# Patient Record
Sex: Female | Born: 1970 | Race: White | Hispanic: Yes | Marital: Married | Smoking: Never smoker
Health system: Southern US, Community
[De-identification: ages and names within clinical notes are randomized; demographics above are authoritative.]

---

## 2000-05-01 ENCOUNTER — Ambulatory Visit (HOSPITAL_COMMUNITY): Admission: RE | Admit: 2000-05-01 | Discharge: 2000-05-01 | Payer: Self-pay | Admitting: *Deleted

## 2000-08-06 ENCOUNTER — Inpatient Hospital Stay (HOSPITAL_COMMUNITY): Admission: AD | Admit: 2000-08-06 | Discharge: 2000-08-08 | Payer: Self-pay | Admitting: *Deleted

## 2000-08-09 ENCOUNTER — Encounter: Admission: RE | Admit: 2000-08-09 | Discharge: 2000-08-13 | Payer: Self-pay | Admitting: *Deleted

## 2000-08-20 ENCOUNTER — Encounter: Admission: RE | Admit: 2000-08-20 | Discharge: 2000-09-19 | Payer: Self-pay | Admitting: *Deleted

## 2003-08-09 ENCOUNTER — Ambulatory Visit (HOSPITAL_COMMUNITY): Admission: RE | Admit: 2003-08-09 | Discharge: 2003-08-09 | Payer: Self-pay | Admitting: *Deleted

## 2003-11-09 ENCOUNTER — Inpatient Hospital Stay (HOSPITAL_COMMUNITY): Admission: AD | Admit: 2003-11-09 | Discharge: 2003-11-09 | Payer: Self-pay | Admitting: Obstetrics & Gynecology

## 2003-11-14 ENCOUNTER — Encounter: Admission: RE | Admit: 2003-11-14 | Discharge: 2003-11-14 | Payer: Self-pay | Admitting: *Deleted

## 2003-11-15 ENCOUNTER — Inpatient Hospital Stay (HOSPITAL_COMMUNITY): Admission: AD | Admit: 2003-11-15 | Discharge: 2003-11-17 | Payer: Self-pay | Admitting: *Deleted

## 2005-02-06 ENCOUNTER — Ambulatory Visit (HOSPITAL_COMMUNITY): Admission: RE | Admit: 2005-02-06 | Discharge: 2005-02-06 | Payer: Self-pay | Admitting: *Deleted

## 2005-06-29 ENCOUNTER — Ambulatory Visit: Payer: Self-pay | Admitting: Family Medicine

## 2005-06-29 ENCOUNTER — Inpatient Hospital Stay (HOSPITAL_COMMUNITY): Admission: AD | Admit: 2005-06-29 | Discharge: 2005-07-01 | Payer: Self-pay | Admitting: *Deleted

## 2005-07-16 ENCOUNTER — Inpatient Hospital Stay (HOSPITAL_COMMUNITY): Admission: AD | Admit: 2005-07-16 | Discharge: 2005-07-16 | Payer: Self-pay | Admitting: Obstetrics and Gynecology

## 2006-04-02 ENCOUNTER — Emergency Department (HOSPITAL_COMMUNITY): Admission: EM | Admit: 2006-04-02 | Discharge: 2006-04-02 | Payer: Self-pay | Admitting: Emergency Medicine

## 2013-08-10 ENCOUNTER — Other Ambulatory Visit (HOSPITAL_COMMUNITY): Payer: Self-pay | Admitting: Family

## 2013-08-10 DIAGNOSIS — Z1231 Encounter for screening mammogram for malignant neoplasm of breast: Secondary | ICD-10-CM

## 2013-08-22 ENCOUNTER — Ambulatory Visit (HOSPITAL_COMMUNITY)
Admission: RE | Admit: 2013-08-22 | Discharge: 2013-08-22 | Disposition: A | Discharge status: Home / Self Care | Payer: Self-pay | Source: Ambulatory Visit | Attending: Family | Admitting: Family

## 2013-08-22 DIAGNOSIS — Z1231 Encounter for screening mammogram for malignant neoplasm of breast: Secondary | ICD-10-CM

## 2014-08-07 ENCOUNTER — Other Ambulatory Visit (HOSPITAL_COMMUNITY): Payer: Self-pay | Admitting: Nurse Practitioner

## 2014-08-07 DIAGNOSIS — Z1231 Encounter for screening mammogram for malignant neoplasm of breast: Secondary | ICD-10-CM

## 2014-08-24 ENCOUNTER — Ambulatory Visit (HOSPITAL_COMMUNITY)
Admission: RE | Admit: 2014-08-24 | Discharge: 2014-08-24 | Disposition: A | Discharge status: Home / Self Care | Payer: Self-pay | Source: Ambulatory Visit | Attending: Nurse Practitioner | Admitting: Nurse Practitioner

## 2014-08-24 DIAGNOSIS — Z1231 Encounter for screening mammogram for malignant neoplasm of breast: Secondary | ICD-10-CM

## 2015-08-01 ENCOUNTER — Other Ambulatory Visit: Payer: Self-pay

## 2016-11-12 ENCOUNTER — Other Ambulatory Visit: Payer: Self-pay | Admitting: Obstetrics & Gynecology

## 2016-11-12 DIAGNOSIS — Z1231 Encounter for screening mammogram for malignant neoplasm of breast: Secondary | ICD-10-CM

## 2016-11-26 ENCOUNTER — Ambulatory Visit
Admission: RE | Admit: 2016-11-26 | Discharge: 2016-11-26 | Disposition: A | Discharge status: Home / Self Care | Payer: No Typology Code available for payment source | Source: Ambulatory Visit | Attending: Obstetrics & Gynecology | Admitting: Obstetrics & Gynecology

## 2016-11-26 DIAGNOSIS — Z1231 Encounter for screening mammogram for malignant neoplasm of breast: Secondary | ICD-10-CM

## 2016-11-27 ENCOUNTER — Other Ambulatory Visit: Payer: Self-pay | Admitting: Obstetrics & Gynecology

## 2016-11-27 DIAGNOSIS — R928 Other abnormal and inconclusive findings on diagnostic imaging of breast: Secondary | ICD-10-CM

## 2016-12-08 ENCOUNTER — Other Ambulatory Visit (HOSPITAL_COMMUNITY): Payer: Self-pay | Admitting: *Deleted

## 2016-12-08 DIAGNOSIS — R928 Other abnormal and inconclusive findings on diagnostic imaging of breast: Secondary | ICD-10-CM

## 2016-12-25 ENCOUNTER — Ambulatory Visit: Admission: RE | Admit: 2016-12-25 | Payer: No Typology Code available for payment source | Source: Ambulatory Visit

## 2016-12-25 ENCOUNTER — Ambulatory Visit
Admission: RE | Admit: 2016-12-25 | Discharge: 2016-12-25 | Disposition: A | Discharge status: Home / Self Care | Payer: No Typology Code available for payment source | Source: Ambulatory Visit | Attending: Obstetrics and Gynecology | Admitting: Obstetrics and Gynecology

## 2016-12-25 ENCOUNTER — Encounter (HOSPITAL_COMMUNITY): Payer: Self-pay

## 2016-12-25 ENCOUNTER — Ambulatory Visit (HOSPITAL_COMMUNITY)
Admission: RE | Admit: 2016-12-25 | Discharge: 2016-12-25 | Disposition: A | Discharge status: Home / Self Care | Payer: Self-pay | Source: Ambulatory Visit | Attending: Obstetrics and Gynecology | Admitting: Obstetrics and Gynecology

## 2016-12-25 VITALS — BP 140/80 | HR 56 | Temp 97.8°F | Ht 60.0 in | Wt 162.1 lb

## 2016-12-25 DIAGNOSIS — R928 Other abnormal and inconclusive findings on diagnostic imaging of breast: Secondary | ICD-10-CM

## 2016-12-25 DIAGNOSIS — Z1239 Encounter for other screening for malignant neoplasm of breast: Secondary | ICD-10-CM

## 2016-12-25 NOTE — Progress Notes (Signed)
[facility] due to recommending additional imaging of the right breast. Screening mammogram completed 11/26/2016.  Pap Smear: Pap smear not completed today. [facility] and normal per patient. Per patient has no history of an abnormal Pap smear. No Pap smear results are in EPIC.  Physical exam: Breasts Breasts symmetrical. No skin abnormalities bilateral breasts. No nipple retraction bilateral breasts. No nipple discharge bilateral breasts. No lymphadenopathy. No lumps palpated bilateral breasts. No complaints of pain or tenderness on exam. [facility] a right breast diagnostic mammogram and breast ultrasound per recommendation. Appointment scheduled for Thursday, December 25, 2016 at 1250.        Pelvic/Bimanual No Pap smear completed today since last Pap smear was in 2016 per patient. Pap smear not indicated per BCCCP guidelines.   Smoking History: Patient has never smoked.  Patient Navigation: Patient education provided. Access to services provided [facility]. Spanish interpreter provided.  Used Spanish interpreter [employer]anca Lindner from [employer].

## 2016-12-25 NOTE — Patient Instructions (Addendum)
Explained breast self awareness with Deanna Anderson. Patient did not need a Pap smear today due to last Pap smear was in 2016 per patient. Let her know BCCCP will cover Pap smears every 3 years unless has a history of abnormal Pap smears. [facility] for a right breast diagnostic mammogram and breast ultrasound per recommendation. Appointment scheduled for Thursday, December 25, 2016 at 1250. Deanna Prosther Anderson verbalized understanding.  Brannock, Kathaleen Maserhristine Poll, RN 2:32 PM

## 2017-02-06 NOTE — Addendum Note (Signed)
Encounter addended by: Priscille Heidelberg, RN on: 02/06/2017  1:46 PM<BR>    Actions taken: Charge Capture section accepted

## 2017-11-11 ENCOUNTER — Encounter (HOSPITAL_COMMUNITY): Payer: Self-pay

## 2017-11-17 ENCOUNTER — Other Ambulatory Visit: Payer: Self-pay | Admitting: Obstetrics & Gynecology

## 2017-11-17 DIAGNOSIS — Z1231 Encounter for screening mammogram for malignant neoplasm of breast: Secondary | ICD-10-CM

## 2017-12-04 ENCOUNTER — Other Ambulatory Visit: Payer: Self-pay | Admitting: Obstetrics and Gynecology

## 2017-12-04 DIAGNOSIS — Z1231 Encounter for screening mammogram for malignant neoplasm of breast: Secondary | ICD-10-CM

## 2018-01-05 ENCOUNTER — Ambulatory Visit
Admission: RE | Admit: 2018-01-05 | Discharge: 2018-01-05 | Disposition: A | Discharge status: Home / Self Care | Payer: Self-pay | Source: Ambulatory Visit | Attending: Obstetrics and Gynecology | Admitting: Obstetrics and Gynecology

## 2018-01-05 ENCOUNTER — Encounter (HOSPITAL_COMMUNITY): Payer: Self-pay

## 2018-01-05 ENCOUNTER — Ambulatory Visit (HOSPITAL_COMMUNITY)
Admission: RE | Admit: 2018-01-05 | Discharge: 2018-01-05 | Disposition: A | Discharge status: Home / Self Care | Payer: Self-pay | Source: Ambulatory Visit | Attending: Obstetrics and Gynecology | Admitting: Obstetrics and Gynecology

## 2018-01-05 VITALS — BP 124/70 | Ht 61.0 in

## 2018-01-05 DIAGNOSIS — Z1231 Encounter for screening mammogram for malignant neoplasm of breast: Secondary | ICD-10-CM

## 2018-01-05 DIAGNOSIS — Z1239 Encounter for other screening for malignant neoplasm of breast: Secondary | ICD-10-CM

## 2018-01-05 NOTE — Patient Instructions (Addendum)
Explained breast self awareness with Maceo ProEsther Martinez-Rivera. Patient did not need a Pap smear today due to last Pap smear was in June 2019 per patient. Let her know BCCCP will cover Pap smears every 3 years unless has a history of abnormal Pap smears. [facility] a [facility] mamm[facility]. Appointment scheduled for Tuesday, January 05, 2018 at 1240. Let patient know that the Breast Center will follow-up with her within the next couple of weeks with results of mammogram by letter or phone. Maceo Prosther Martinez-Rivera verbalized understanding.  Anicia Leuthold, Kathaleen Maserhristine Poll, RN 1:34 PM

## 2018-01-05 NOTE — Progress Notes (Signed)
No complaints today.   Pap Smear: Pap smear not completed today. [facility] and normal per patient. Per patient has no history of an abnormal Pap smear. No Pap smear results are in EPIC.  Physical exam: Breasts Breasts symmetrical. No skin abnormalities bilateral breasts. No nipple retraction bilateral breasts. No nipple discharge bilateral breasts. No lymphadenopathy. No lumps palpated bilateral breasts. No complaints of pain or tenderness on exam. [facility] a screening mammogram. Appointment scheduled for Tuesday, January 05, 2018 at 1240.        Pelvic/Bimanual No Pap smear completed today since last Pap smear was in June 2019 per patient. Pap smear not indicated per BCCCP guidelines.   Smoking History: Patient has never smoked.  Patient Navigation: Patient education provided. Access to services provided [facility]. Spanish interpreter provided.   Breast and Cervical Cancer Risk Assessment: Patient has no family history of breast cancer, known genetic mutations, or radiation treatment to the chest before age 45. Patient has no history of cervical dysplasia, immunocompromised, or DES exposure in-utero.  Risk Assessment    Risk Scores      01/05/2018   Last edited by: Priscille HeidelbergBrannock, Neymar Dowe P, RN   5-year risk: 0.9 %   Lifetime risk: 10.2 %          Used Spanish interpreter Nile RiggsMariel Gallego from [employer].

## 2018-01-06 ENCOUNTER — Encounter (HOSPITAL_COMMUNITY): Payer: Self-pay | Admitting: *Deleted

## 2019-05-04 ENCOUNTER — Other Ambulatory Visit (HOSPITAL_COMMUNITY): Payer: Self-pay | Admitting: *Deleted

## 2019-05-04 DIAGNOSIS — Z1231 Encounter for screening mammogram for malignant neoplasm of breast: Secondary | ICD-10-CM

## 2019-06-28 ENCOUNTER — Other Ambulatory Visit: Payer: Self-pay

## 2019-06-28 ENCOUNTER — Encounter (HOSPITAL_COMMUNITY): Payer: Self-pay

## 2019-06-28 ENCOUNTER — Ambulatory Visit
Admission: RE | Admit: 2019-06-28 | Discharge: 2019-06-28 | Disposition: A | Discharge status: Home / Self Care | Payer: No Typology Code available for payment source | Source: Ambulatory Visit | Attending: Obstetrics and Gynecology | Admitting: Obstetrics and Gynecology

## 2019-06-28 ENCOUNTER — Ambulatory Visit (HOSPITAL_COMMUNITY)
Admission: RE | Admit: 2019-06-28 | Discharge: 2019-06-28 | Disposition: A | Discharge status: Home / Self Care | Payer: No Typology Code available for payment source | Source: Ambulatory Visit | Attending: Obstetrics and Gynecology | Admitting: Obstetrics and Gynecology

## 2019-06-28 DIAGNOSIS — Z1239 Encounter for other screening for malignant neoplasm of breast: Secondary | ICD-10-CM | POA: Insufficient documentation

## 2019-06-28 DIAGNOSIS — Z1231 Encounter for screening mammogram for malignant neoplasm of breast: Secondary | ICD-10-CM

## 2019-06-28 NOTE — Patient Instructions (Signed)
Explained breast self awareness with [name]. Patient did not need a Pap smear today due to last Pap smear was 11/11/2017. Let her know BCCCP will cover Pap smears every 3 years unless has a history of abnormal Pap smears. Referred patient to the [facility] of [facility] for a screening mammogram. Appointment scheduled for Tuesday, June 28, 2019 at 1430. Patient aware of appointment and will be there. Let patient know the Breast Center will follow up with her within the next couple weeks with results of her mammogram by letter or phone. Deanna Anderson verbalized understanding.  Clora Ohmer, [name], RN 1:25 PM

## 2019-06-28 NOTE — Progress Notes (Signed)
No complaints today.   Pap Smear: Pap smear not completed today. Last Pap smear was 11/11/2017 at the [facility] and normal. Per patient has no history of an abnormal Pap smear. Last Pap smear result is in EPIC.  Physical exam: Breasts Breasts symmetrical. No skin abnormalities bilateral breasts. No nipple retraction bilateral breasts. No nipple discharge bilateral breasts. No lymphadenopathy. No lumps palpated bilateral breasts. No complaints of pain or tenderness on exam. Referred patient to the [facility] of [facility] for a screening mammogram. Appointment scheduled for Tuesday, June 28, 2019 at 1430.        Pelvic/Bimanual No Pap smear completed today since last Pap smear was 11/11/2017. Pap smear not indicated per BCCCP guidelines.   Smoking History: Patient has never smoked.  Patient Navigation: Patient education provided. Access to services provided for patient through [facility] program. Spanish interpreter provided.   Breast and Cervical Cancer Risk Assessment: Patient has no family history of breast cancer, known genetic mutations, or radiation treatment to the chest before age 58. Patient has no history of cervical dysplasia, immunocompromised, or DES exposure in-utero.  Risk Assessment    Risk Scores      06/28/2019 01/05/2018   Last edited by: Deanna Rutherford, LPN Deanna Anderson, Deanna Grippe, RN   5-year risk: 0.9 % 0.9 %   Lifetime risk: 10 % 10.2 %         Used Spanish interpreter Deanna Anderson from [employer].

## 2019-10-13 ENCOUNTER — Ambulatory Visit: Payer: No Typology Code available for payment source | Attending: Internal Medicine

## 2019-10-13 DIAGNOSIS — Z23 Encounter for immunization: Secondary | ICD-10-CM

## 2019-10-13 NOTE — Progress Notes (Signed)
   Covid-19 Vaccination Clinic  Name:  Deanna Anderson    MRN: [id number] DOB: 05/23/1970  10/13/2019  [name] was observed post Covid-19 immunization for 15 minutes without incident. She was provided with Vaccine Information Sheet and instruction to access the V-Safe system.   [name] was instructed to call 911 with any severe reactions post vaccine: Marland Kitchen Difficulty breathing  . Swelling of face and throat  . A fast heartbeat  . A bad rash all over body  . Dizziness and weakness   Immunizations Administered    Name Date Dose VIS Date Route   Pfizer COVID-19 Vaccine 10/13/2019  9:49 AM 0.3 mL 07/13/2018 Intramuscular   Manufacturer: [employer], [employer]   Lot: [id number]   NDC: [address]-4

## 2019-11-07 ENCOUNTER — Ambulatory Visit: Payer: No Typology Code available for payment source | Attending: Internal Medicine

## 2019-11-07 DIAGNOSIS — Z23 Encounter for immunization: Secondary | ICD-10-CM

## 2019-11-07 NOTE — Progress Notes (Signed)
   Covid-19 Vaccination Clinic  Name:  Deanna Anderson    MRN: [id number] DOB: Aug 14, 1970  11/07/2019  [name] was observed post Covid-19 immunization for 15 minutes without incident. She was provided with Vaccine Information Sheet and instruction to access the V-Safe system.   [name] was instructed to call 911 with any severe reactions post vaccine: Marland Kitchen Difficulty breathing  . Swelling of face and throat  . A fast heartbeat  . A bad rash all over body  . Dizziness and weakness   Immunizations Administered    Name Date Dose VIS Date Route   Pfizer COVID-19 Vaccine 11/07/2019  8:27 AM 0.3 mL 07/13/2018 Intramuscular   Manufacturer: [employer], [employer]   Lot: [id number]   NDC: [address]-0

## 2020-03-27 ENCOUNTER — Other Ambulatory Visit: Payer: Self-pay | Admitting: Obstetrics and Gynecology

## 2020-03-27 DIAGNOSIS — Z1231 Encounter for screening mammogram for malignant neoplasm of breast: Secondary | ICD-10-CM

## 2020-06-21 ENCOUNTER — Ambulatory Visit
Admission: RE | Admit: 2020-06-21 | Discharge: 2020-06-21 | Disposition: A | Discharge status: Home / Self Care | Payer: No Typology Code available for payment source | Source: Ambulatory Visit | Attending: Obstetrics and Gynecology | Admitting: Obstetrics and Gynecology

## 2020-06-21 DIAGNOSIS — Z1231 Encounter for screening mammogram for malignant neoplasm of breast: Secondary | ICD-10-CM

## 2021-05-01 IMAGING — MG MM DIGITAL SCREENING BILAT W/ TOMO AND CAD
6 of 10 series · 6 of 30 positions shown · non-contrast
Comparison: Previous exam(s).

CLINICAL DATA: Screening.

EXAM:
DIGITAL SCREENING BILATERAL MAMMOGRAM WITH TOMOSYNTHESIS AND CAD

[L CC synth-2D]
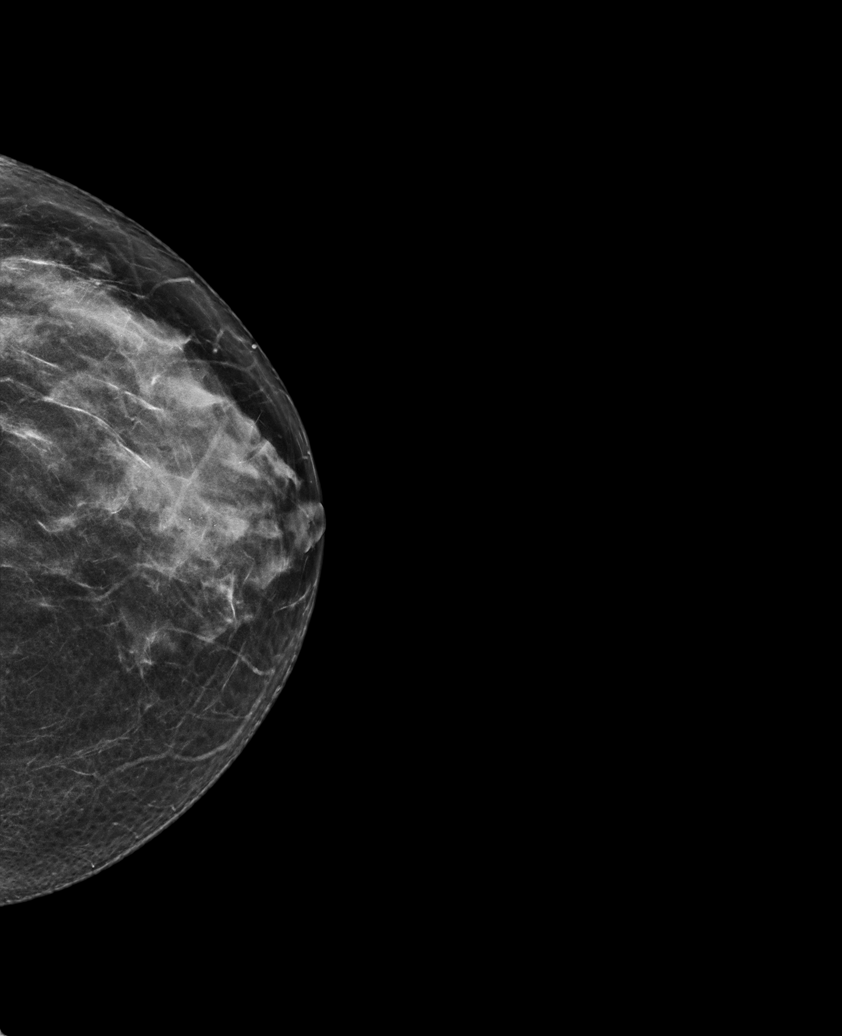

[R MLO synth-2D]
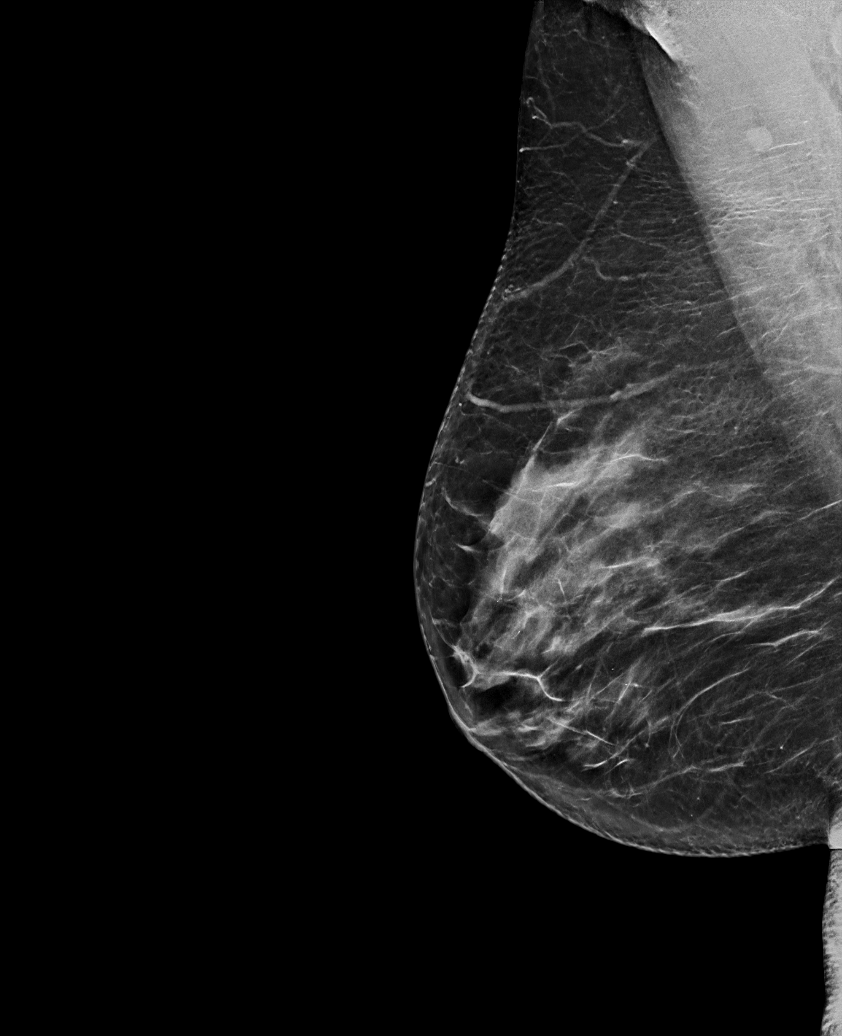

[L MLO synth-2D (1 of 2)]
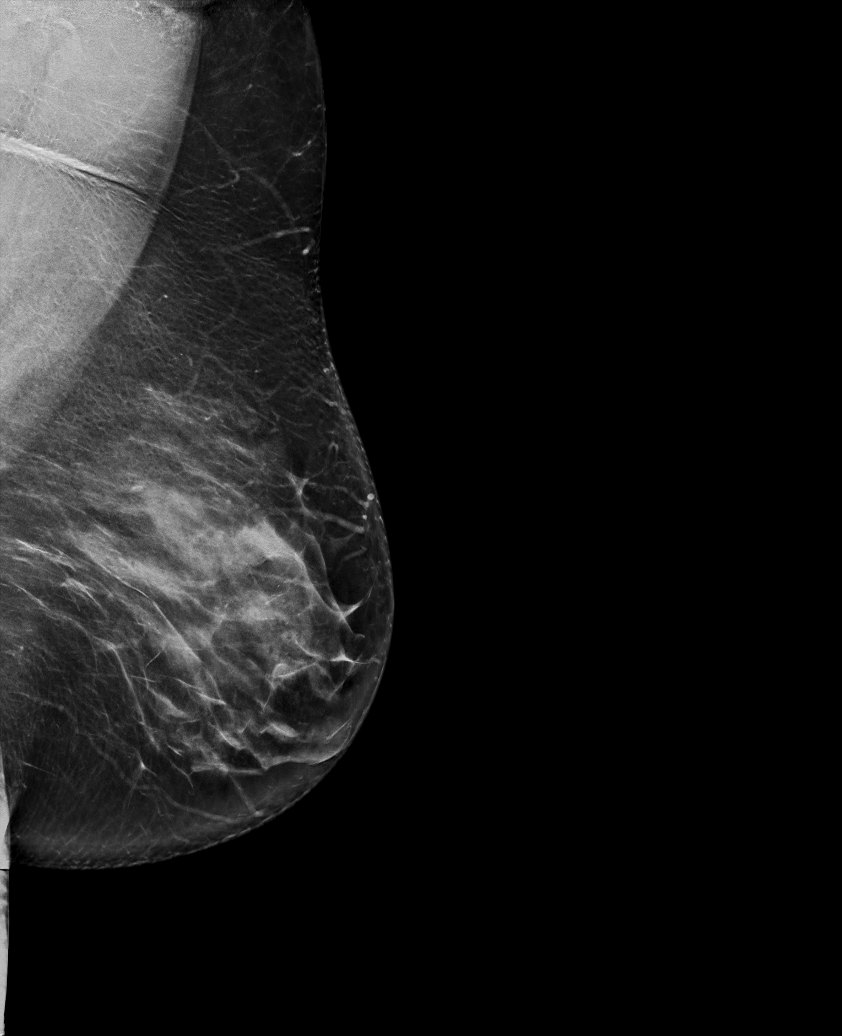

[R CC synth-2D]
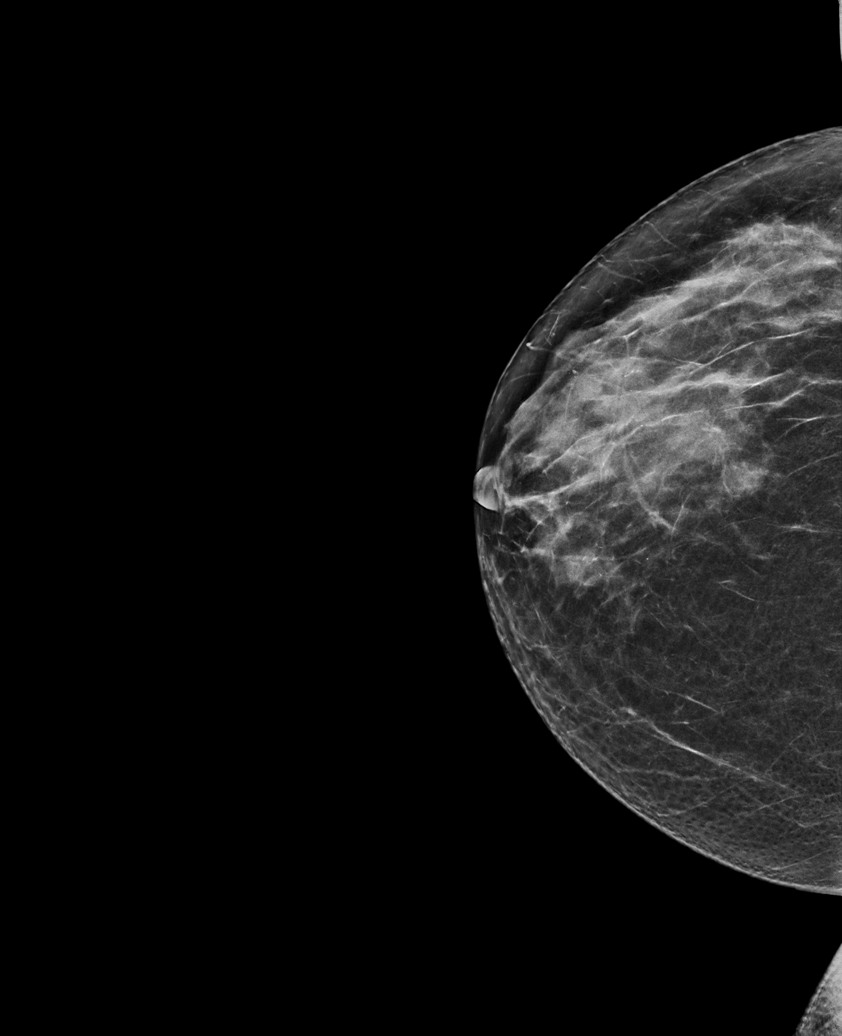

[L MLO synth-2D (2 of 2)]
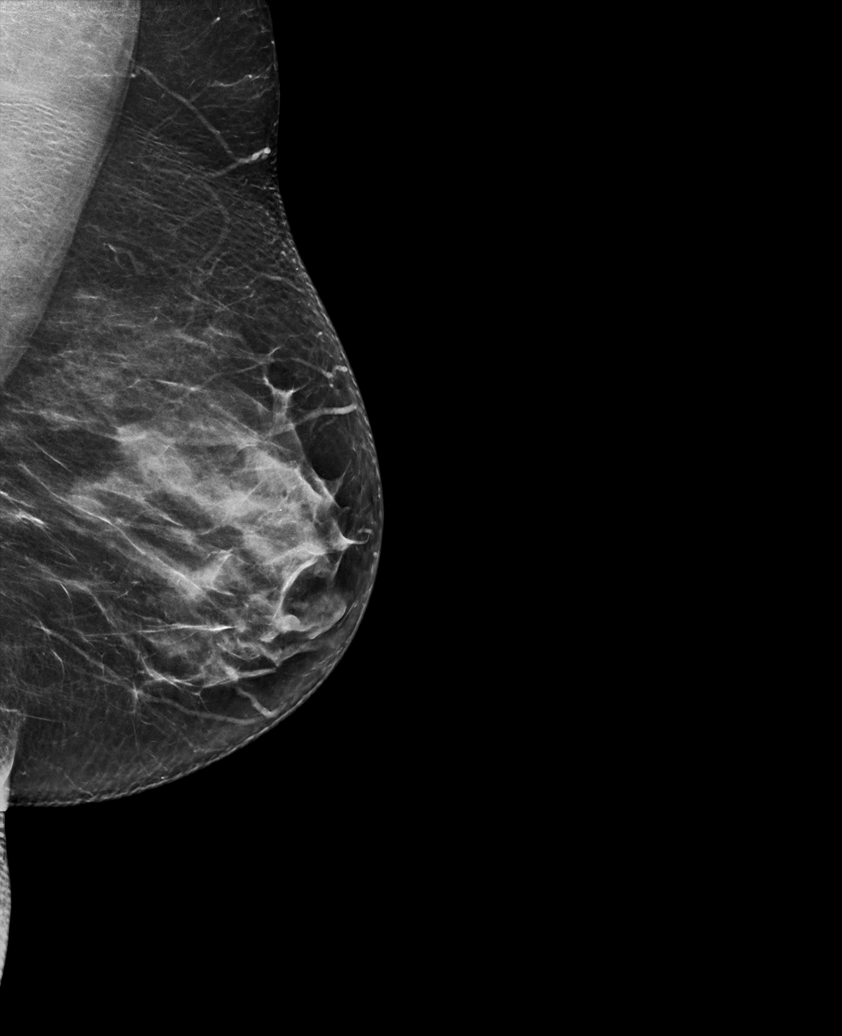

[R MLO tomo · tomo slice 37/74.0]
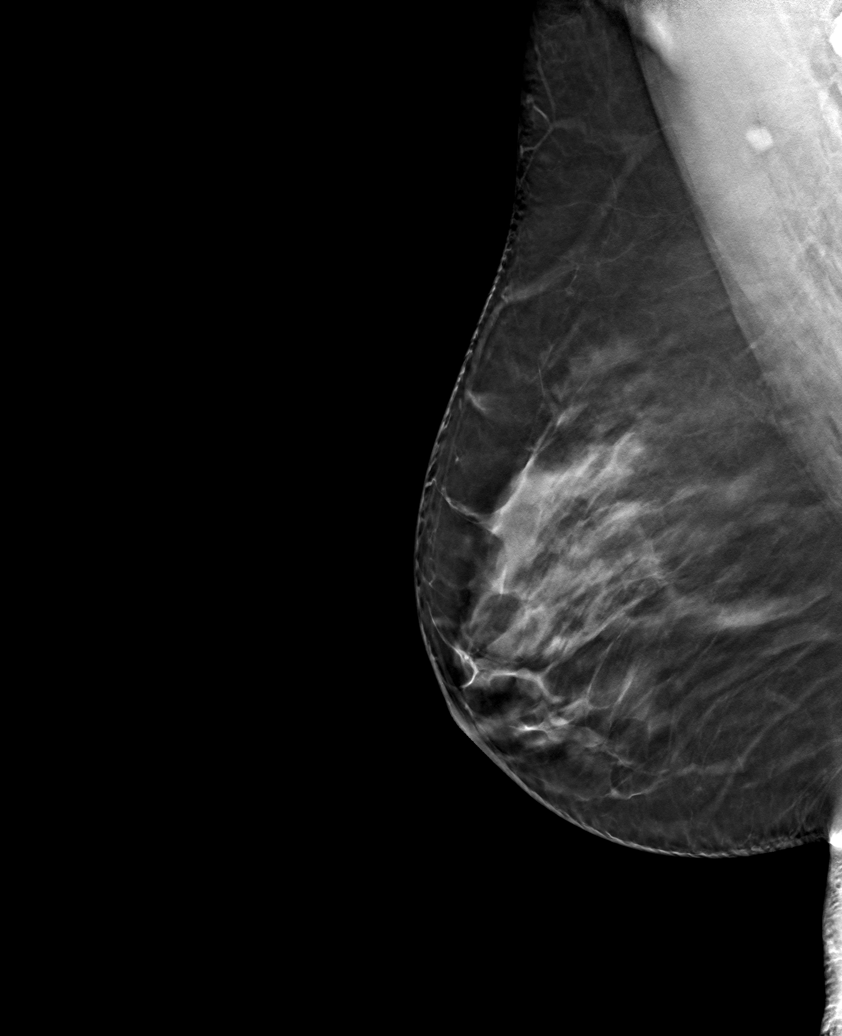

[6 of 30 positions shown; findings below may reference images not displayed]

ACR Breast Density Category c: The breast tissue is heterogeneously
dense, which may obscure small masses.
FINDINGS: There are no findings suspicious for malignancy.
IMPRESSION: No mammographic evidence of malignancy. A result letter of this
screening mammogram will be mailed directly to the patient.

RECOMMENDATION:
Screening mammogram in one year. (Code:PI-1-R0Y)

BI-RADS CATEGORY  1: Negative.

## 2022-03-05 ENCOUNTER — Other Ambulatory Visit: Payer: Self-pay | Admitting: Obstetrics & Gynecology

## 2022-03-05 DIAGNOSIS — Z1231 Encounter for screening mammogram for malignant neoplasm of breast: Secondary | ICD-10-CM

## 2022-04-03 ENCOUNTER — Ambulatory Visit
Admission: RE | Admit: 2022-04-03 | Discharge: 2022-04-03 | Disposition: A | Discharge status: Home / Self Care | Payer: No Typology Code available for payment source | Source: Ambulatory Visit | Attending: Obstetrics & Gynecology | Admitting: Obstetrics & Gynecology

## 2022-04-03 DIAGNOSIS — Z1231 Encounter for screening mammogram for malignant neoplasm of breast: Secondary | ICD-10-CM

## 2022-09-30 ENCOUNTER — Telehealth: Payer: Self-pay

## 2022-09-30 NOTE — Telephone Encounter (Signed)
Telephoned patient using interpreter#[id number]. Voice mail not an option, BCCCP (scholarship) unable to leave a message.

## 2023-05-29 ENCOUNTER — Other Ambulatory Visit: Payer: Self-pay | Admitting: Obstetrics and Gynecology

## 2023-05-29 DIAGNOSIS — Z1231 Encounter for screening mammogram for malignant neoplasm of breast: Secondary | ICD-10-CM

## 2023-06-09 ENCOUNTER — Ambulatory Visit
Admission: RE | Admit: 2023-06-09 | Discharge: 2023-06-09 | Disposition: A | Discharge status: Home / Self Care | Payer: No Typology Code available for payment source | Source: Ambulatory Visit | Attending: Obstetrics and Gynecology | Admitting: Obstetrics and Gynecology

## 2023-06-09 ENCOUNTER — Ambulatory Visit: Payer: Self-pay | Admitting: *Deleted

## 2023-06-09 VITALS — BP 158/90 | Wt 178.0 lb

## 2023-06-09 DIAGNOSIS — Z1211 Encounter for screening for malignant neoplasm of colon: Secondary | ICD-10-CM

## 2023-06-09 DIAGNOSIS — Z1239 Encounter for other screening for malignant neoplasm of breast: Secondary | ICD-10-CM

## 2023-06-09 DIAGNOSIS — Z1231 Encounter for screening mammogram for malignant neoplasm of breast: Secondary | ICD-10-CM

## 2023-06-09 NOTE — Progress Notes (Signed)
Ms. Deanna Anderson is a 53 y.o. female who presents to [facility] clinic today with no complaints.    Pap Smear: Pap smear not completed today. Last Pap smear was 11/11/2017 at the [facility] and normal. Per patient has no history of an abnormal Pap smear. Last Pap smear result is in EPIC.    Physical exam: Breasts Breasts symmetrical. No skin abnormalities bilateral breasts. No nipple retraction bilateral breasts. No nipple discharge bilateral breasts. No lymphadenopathy. No lumps palpated bilateral breasts. No complaints of pain or tenderness on exam.  MS DIGITAL SCREENING TOMO BILATERAL Result Date: 04/07/2022 CLINICAL DATA:  Screening. EXAM: DIGITAL SCREENING BILATERAL MAMMOGRAM WITH TOMOSYNTHESIS AND CAD TECHNIQUE: Bilateral screening digital craniocaudal and mediolateral oblique mammograms were obtained. Bilateral screening digital breast tomosynthesis was performed. The images were evaluated with computer-aided detection. COMPARISON:  Previous exam(s). ACR Breast Density Category c: The breast tissue is heterogeneously dense, which may obscure small masses. FINDINGS: There are no findings suspicious for malignancy. IMPRESSION: No mammographic evidence of malignancy. A result letter of this screening mammogram will be mailed directly to the patient. RECOMMENDATION: Screening mammogram in one year. (Code:SM-B-01Y) BI-RADS CATEGORY  1: Negative. Electronically Signed   By: Ted Mcalpine M.D.   On: 04/07/2022 13:19   MS DIGITAL SCREENING TOMO BILATERAL Result Date: 06/26/2020 CLINICAL DATA:  Screening. EXAM: DIGITAL SCREENING BILATERAL MAMMOGRAM WITH TOMOSYNTHESIS AND CAD COMPARISON:  Previous exam(s). ACR Breast Density Category c: The breast tissue is heterogeneously dense, which may obscure small masses. FINDINGS: There are no findings suspicious for malignancy. IMPRESSION: No mammographic evidence of malignancy. A result letter of this screening mammogram will be  mailed directly to the patient. RECOMMENDATION: Screening mammogram in one year. (Code:SM-B-01Y) BI-RADS CATEGORY  1: Negative. Electronically Signed   By: Gerome Sam III M.D   On: 06/26/2020 10:03   MS DIGITAL SCREENING TOMO BILATERAL Result Date: 06/30/2019 CLINICAL DATA:  Screening. EXAM: DIGITAL SCREENING BILATERAL MAMMOGRAM WITH TOMO AND CAD COMPARISON:  Previous exam(s). ACR Breast Density Category c: The breast tissue is heterogeneously dense, which may obscure small masses. FINDINGS: There are no findings suspicious for malignancy. Images were processed with CAD. IMPRESSION: No mammographic evidence of malignancy. A result letter of this screening mammogram will be mailed directly to the patient. RECOMMENDATION: Screening mammogram in one year. (Code:SM-B-01Y) BI-RADS CATEGORY  1: Negative. Electronically Signed   By: Gerome Sam III M.D   On: 06/30/2019 15:54    Pelvic/Bimanual Pap smear is due today. Patient refused Pap smear today. Patient stated she has an appointment scheduled for her Pap smear at the [facility] in April 2025.   Smoking History: Patient has never smoked.   Patient Navigation: Patient education provided. Access to services provided for patient through [facility] program. Spanish interpreter Deanna Anderson from [employer] [facility] [facility] provided.   Colorectal Cancer Screening: Per patient has never had colonoscopy completed. FIT Test given to patient to complete. No complaints today.    Breast and Cervical Cancer Risk Assessment: Patient does not have family history of breast cancer, known genetic mutations, or radiation treatment to the chest before age 52. Patient does not have history of cervical dysplasia, immunocompromised, or DES exposure in-utero.  Risk Scores as of Encounter on 06/09/2023     Deanna Anderson           5-year 0.65%   Lifetime 6.59%            Last calculated by Deanna Anderson, CMA on 06/09/2023 at  3:39  PM        A: BCCCP exam  without pap smear No complaints.  P: Referred patient to the Breast Center of [facility] for a screening mammogram on mobile unit. Appointment scheduled Tuesday, June 09, 2023 at 1540.  Deanna Heidelberg, RN 06/09/2023 3:59 PM

## 2023-06-09 NOTE — Patient Instructions (Signed)
Explained breast self awareness with [name]. Pap smear is due today. Patient refused Pap smear today. Patient stated she has an appointment scheduled for her Pap smear at the [facility] in April 2025. Let her know BCCCP will cover Pap smears and HPV typing every 5 years unless has a history of abnormal Pap smears. Referred patient to the [facility] Center of [facility] for a screening mammogram on mobile unit. Appointment scheduled Tuesday, June 09, 2023 at 1540. Patient aware of appointment and will be there. Let patient know the Breast Center will follow up with her within the next couple weeks with results of mammogram by letter or phone. Deanna Anderson verbalized understanding.  [name], [name], RN 3:59 PM

## 2023-06-17 LAB — FECAL OCCULT BLOOD, IMMUNOCHEMICAL: Fecal Occult Bld: NEGATIVE

## 2023-12-14 ENCOUNTER — Emergency Department (HOSPITAL_COMMUNITY): Payer: Self-pay

## 2023-12-14 ENCOUNTER — Inpatient Hospital Stay (HOSPITAL_COMMUNITY)
Admission: EM | Admit: 2023-12-14 | Discharge: 2023-12-18 | DRG: 835 | Disposition: A | Discharge status: Home / Self Care | Payer: MEDICAID | Attending: Internal Medicine | Admitting: Internal Medicine

## 2023-12-14 ENCOUNTER — Other Ambulatory Visit: Payer: Self-pay

## 2023-12-14 ENCOUNTER — Encounter (HOSPITAL_COMMUNITY): Payer: Self-pay | Admitting: Emergency Medicine

## 2023-12-14 DIAGNOSIS — Z79899 Other long term (current) drug therapy: Secondary | ICD-10-CM

## 2023-12-14 DIAGNOSIS — Z555 Less than a high school diploma: Secondary | ICD-10-CM

## 2023-12-14 DIAGNOSIS — E66811 Obesity, class 1: Secondary | ICD-10-CM | POA: Diagnosis present

## 2023-12-14 DIAGNOSIS — C9100 Acute lymphoblastic leukemia not having achieved remission: Principal | ICD-10-CM | POA: Diagnosis present

## 2023-12-14 DIAGNOSIS — D61818 Other pancytopenia: Principal | ICD-10-CM | POA: Diagnosis present

## 2023-12-14 DIAGNOSIS — R1011 Right upper quadrant pain: Secondary | ICD-10-CM | POA: Diagnosis present

## 2023-12-14 DIAGNOSIS — Z806 Family history of leukemia: Secondary | ICD-10-CM

## 2023-12-14 DIAGNOSIS — Z808 Family history of malignant neoplasm of other organs or systems: Secondary | ICD-10-CM

## 2023-12-14 DIAGNOSIS — K760 Fatty (change of) liver, not elsewhere classified: Secondary | ICD-10-CM | POA: Diagnosis present

## 2023-12-14 DIAGNOSIS — Z6833 Body mass index (BMI) 33.0-33.9, adult: Secondary | ICD-10-CM

## 2023-12-14 LAB — CBC WITH DIFFERENTIAL/PLATELET

## 2023-12-14 LAB — ABO/RH: ABO/RH(D): A POS

## 2023-12-14 LAB — PROTIME-INR
INR: 1.1 (ref 0.8–1.2)
Prothrombin Time: 14.5 s (ref 11.4–15.2)

## 2023-12-14 LAB — LIPASE, BLOOD: Lipase: 44 U/L (ref 11–51)

## 2023-12-14 LAB — APTT: aPTT: 27 s (ref 24–36)

## 2023-12-14 LAB — COMPREHENSIVE METABOLIC PANEL WITH GFR
ALT: 62 U/L — ABNORMAL HIGH (ref 0–44)
AST: 42 U/L — ABNORMAL HIGH (ref 15–41)
Albumin: 4 g/dL (ref 3.5–5.0)
Alkaline Phosphatase: 142 U/L — ABNORMAL HIGH (ref 38–126)
Anion gap: 10 (ref 5–15)
BUN: 15 mg/dL (ref 6–20)
CO2: 19 mmol/L — ABNORMAL LOW (ref 22–32)
Calcium: 8.8 mg/dL — ABNORMAL LOW (ref 8.9–10.3)
Chloride: 108 mmol/L (ref 98–111)
Creatinine, Ser: 0.54 mg/dL (ref 0.44–1.00)
GFR, Estimated: >60 mL/min (ref >60)
Glucose, Bld: 114 mg/dL — ABNORMAL HIGH (ref 70–99)
Potassium: 3.9 mmol/L (ref 3.5–5.1)
Sodium: 137 mmol/L (ref 135–145)
Total Bilirubin: 1 mg/dL (ref 0.0–1.2)
Total Protein: 7.7 g/dL (ref 6.5–8.1)

## 2023-12-14 LAB — RETICULOCYTES
Immature Retic Fract: 17.1 % — ABNORMAL HIGH (ref 2.3–15.9)
RBC.: 2.18 MIL/uL — ABNORMAL LOW (ref 3.87–5.11)
Retic Count, Absolute: 26.6 K/uL (ref 19.0–186.0)
Retic Ct Pct: 1.2 % (ref 0.4–3.1)

## 2023-12-14 LAB — POC OCCULT BLOOD, ED: Fecal Occult Blood: NEGATIVE

## 2023-12-14 LAB — FIBRINOGEN: Fibrinogen: 460 mg/dL (ref 210–475)

## 2023-12-14 LAB — PREPARE RBC (CROSSMATCH)

## 2023-12-14 MED ORDER — SODIUM CHLORIDE 0.9% IV SOLUTION: Freq: Once | INTRAVENOUS | Status: AC

## 2023-12-14 MED ORDER — IOHEXOL 300 MG/ML  SOLN
100.0000 mL | Freq: Once | INTRAMUSCULAR | Status: AC | PRN
  Administered 2023-12-14: 100 mL via INTRAVENOUS

## 2023-12-14 NOTE — ED Provider Notes (Signed)
 [place] EMERGENCY DEPARTMENT AT [facility] Provider Note   CSN: [id number] Arrival date & time: 12/14/23  8171     Patient presents with: Abdominal Pain   Deanna Anderson is a 53 y.o. female otherwise healthy presents with complaints of right upper quadrant abdominal pain that started yesterday.  States it was worse with eating.  Notes that additionally over the past 2 weeks she has been having easy bruising.  Denies any bloody stools or vaginal bleeding.  Reports that she has has periodically over the past week had minimal epistaxis.  Does endorse feeling short of breath with exertion.  No history of blood transfusion.    Abdominal Pain    History reviewed. No pertinent past medical history.]  Prior to Admission medications   Medication Sig Start Date End Date Taking? Authorizing Provider  calcium carbonate (OS-CAL) 1250 (500 Ca) MG chewable tablet Chew 1 tablet by mouth daily.    [provider]  Multiple Vitamin (MULTIVITAMIN) capsule Take 1 capsule by mouth daily. Patient not taking: Reported on 06/09/2023    [provider]    Allergies: Patient has no known allergies.    Review of Systems  Gastrointestinal:  Positive for abdominal pain.    Updated Vital Signs BP 120/69   Pulse 75   Temp 98.5 F (36.9 C) (Oral)   Resp 16   Ht 5' 1 (1.549 m)   Wt 80 kg   SpO2 100%   BMI 33.32 kg/m   Physical Exam Vitals and nursing note reviewed.  Constitutional:      General: She is not in acute distress.    Appearance: She is well-developed.  HENT:     Head: Normocephalic and atraumatic.  Eyes:     Conjunctiva/sclera: Conjunctivae normal.  Cardiovascular:     Rate and Rhythm: Normal rate and regular rhythm.     Heart sounds: No murmur heard. Pulmonary:     Effort: Pulmonary effort is normal. No respiratory distress.     Breath sounds: Normal breath sounds.  Abdominal:     Palpations: Abdomen is soft.     Tenderness: There is  abdominal tenderness.     Comments: Mild tenderness to right upper quadrant, soft nondistended  Musculoskeletal:        General: No swelling.     Cervical back: Neck supple.  Skin:    General: Skin is warm and dry.     Capillary Refill: Capillary refill takes less than 2 seconds.     Coloration: Skin is pale.     Findings: Bruising present.  Neurological:     Mental Status: She is alert.  Psychiatric:        Mood and Affect: Mood normal.     (all labs ordered are listed, but only abnormal results are displayed) Labs Reviewed  COMPREHENSIVE METABOLIC PANEL WITH GFR - Abnormal; Notable for the following components:      Result Value   CO2 19 (*)    Glucose, Bld 114 (*)    Calcium 8.8 (*)    AST 42 (*)    ALT 62 (*)    Alkaline Phosphatase 142 (*)    All other components within normal limits  CBC - Abnormal; Notable for the following components:   WBC 3.1 (*)    RBC 2.43 (*)    Hemoglobin 6.9 (*)    HCT 21.0 (*)    Platelets 17 (*)    nRBC 2.6 (*)    All other components within  normal limits  GRAM STAIN  LIPASE, BLOOD  URINALYSIS, ROUTINE W REFLEX MICROSCOPIC  PROTIME-INR  APTT  FIBRINOGEN   CBC WITH DIFFERENTIAL/PLATELET  RETICULOCYTES  LACTATE DEHYDROGENASE  VITAMIN B12  FOLATE  METHYLMALONIC ACID, SERUM  COPPER , SERUM  FLOW CYTOMETRY  URIC ACID  HAPTOGLOBIN  POC OCCULT BLOOD, ED  TYPE AND SCREEN  PREPARE RBC (CROSSMATCH)  ABO/RH  DIRECT ANTIGLOBULIN TEST (NOT AT [facility])    EKG: None  Radiology: CT ABDOMEN PELVIS W CONTRAST Result Date: 12/14/2023 CLINICAL DATA:  Acute nonlocalized abdominal EXAM: CT ABDOMEN AND PELVIS WITH CONTRAST TECHNIQUE: Multidetector CT imaging of the abdomen and pelvis was performed using the standard protocol following bolus administration of intravenous contrast. RADIATION DOSE REDUCTION: This exam was performed according to the departmental dose-optimization program which includes automated exposure control, adjustment of the mA  and/or kV according to patient size and/or use of iterative reconstruction technique. CONTRAST:  OMNIPAQUE  IOHEXOL  300 MG/ML  SOLN COMPARISON:  None Available. FINDINGS: Lower chest: No acute abnormality. Hepatobiliary: Mild hepatic steatosis. No enhancing intrahepatic mass. No intra or extrahepatic biliary ductal dilation. Gallbladder unremarkable. Pancreas: Unremarkable Spleen: Unremarkable Adrenals/Urinary Tract: Adrenal glands are unremarkable. Kidneys are normal, without renal calculi, focal lesion, or hydronephrosis. Bladder is unremarkable. Stomach/Bowel: Stomach is within normal limits. Appendix appears normal. No evidence of bowel wall thickening, distention, or inflammatory changes. Vascular/Lymphatic: No significant vascular findings are present. No enlarged abdominal or pelvic lymph nodes. Reproductive: Uterus and bilateral adnexa are unremarkable. Other: No abdominal wall hernia or abnormality. No abdominopelvic ascites. Musculoskeletal: Remote T11 superior endplate fracture with mild loss of height and no retropulsion. No acute bone abnormality. No lytic or blastic bone lesion. IMPRESSION: 1. No acute intra-abdominal pathology identified. 2. Mild hepatic steatosis. 3. Remote T11 superior endplate fracture. Electronically Signed   By: Dorethia Molt M.D.   On: 12/14/2023 21:52     Procedures   Medications Ordered in the ED  0.9 %  sodium chloride  infusion (Manually program via Guardrails IV Fluids) ( Intravenous New Bag/Given 12/14/23 2243)  iohexol  (OMNIPAQUE ) 300 MG/ML solution 100 mL (100 mLs Intravenous Contrast Given 12/14/23 2132)                                    Medical Decision Making Amount and/or Complexity of Data Reviewed Labs: ordered.   This patient presents to the ED with chief complaint(s) of abdominal pain.  The complaint involves an extensive differential diagnosis and also carries with it a high risk of complications and morbidity.   Pertinent past medical  history as listed in HPI  The differential diagnosis includes  Cholecystitis, choledocholithiasis, GI bleed, malignancy Additional history obtained: Additional history obtained from family Records reviewed Care Everywhere/External Records  Assessment and management:   Patient presents mildly tachycardic with complaints of right upper quadrant abdominal pain started yesterday in addition to easy bruising and dyspnea on exertion x 2 weeks.  No nausea or vomiting.  Patient is tender in the right upper quadrant.  Abdomen is soft nondistended.  Her lung sounds are clear.  She notably has a hemoglobin of 6.9 and platelets of 17,000.  Has no history of blood disorders or requiring transfusions.  No prior abdominal surgeries.  She denies any active bleeding.  Discussed patient with Dr. MARLA.  Recommended adding labs listed below and imaging for further workup.  CT abdomen pelvis without acute abnormality.  Independent ECG interpretation:  Sinus  rhythm, borderline prolonged QT  Independent labs interpretation:  The following labs were independently interpreted:    Independent visualization and interpretation of imaging: I independently visualized the following imaging with scope of interpretation limited to determining acute life threatening conditions related to emergency care: CT abdomen pelvis no acute intra-abdominal pathology identified, mild hepatic steatosis    Consultations obtained:   Hematology/Oncology Dr. Sharie recommended: PT, PTT, fibrinogen , retics, LDH, B12,folate, Methyl malonic acid, copper , differential, smear, flow cytometry for leukemia panel, uric acid, Direct Coombs test, haptoglobulin  Admission, will consult in the morning  Disposition:   Plan for patient to be admitted for further workup and management. Dr. Roselyn aware of patient  Social Determinants of Health:   none  This note was dictated with voice recognition software.  Despite best efforts at  proofreading, errors may have occurred which can change the documentation meaning.       Final diagnoses:  Pancytopenia (HCC)  Right upper quadrant abdominal pain    ED Discharge Orders     None          Donnajean Lynwood VEAR DEVONNA 12/14/23 2342    Melvenia Motto, MD 12/15/23 2141

## 2023-12-14 NOTE — ED Notes (Signed)
 Paged Dr Rogers  x 2

## 2023-12-14 NOTE — ED Triage Notes (Addendum)
 Pain under RT breast area that radiates around to back that started yesterday that has continued to get worse.  Pain is worse when she leans down. Took ibuprofen at 1700 today with some relief.

## 2023-12-15 DIAGNOSIS — D61818 Other pancytopenia: Principal | ICD-10-CM | POA: Diagnosis present

## 2023-12-15 DIAGNOSIS — R1011 Right upper quadrant pain: Secondary | ICD-10-CM

## 2023-12-15 LAB — URINALYSIS, ROUTINE W REFLEX MICROSCOPIC
Bilirubin Urine: NEGATIVE
Glucose, UA: NEGATIVE mg/dL
Ketones, ur: NEGATIVE mg/dL
Nitrite: POSITIVE — AB
Protein, ur: NEGATIVE mg/dL
Specific Gravity, Urine: 1.045 — ABNORMAL HIGH (ref 1.005–1.030)
pH: 5 (ref 5.0–8.0)

## 2023-12-15 LAB — HEMOGLOBIN AND HEMATOCRIT, BLOOD
HCT: 26.6 % — ABNORMAL LOW (ref 36.0–46.0)
Hemoglobin: 9.2 g/dL — ABNORMAL LOW (ref 12.0–15.0)

## 2023-12-15 LAB — DIRECT ANTIGLOBULIN TEST (NOT AT ARMC)
DAT, IgG: NEGATIVE
DAT, complement: NEGATIVE

## 2023-12-15 LAB — URIC ACID: Uric Acid, Serum: 5.5 mg/dL (ref 2.5–7.1)

## 2023-12-15 LAB — LACTATE DEHYDROGENASE: LDH: 268 U/L — ABNORMAL HIGH (ref 98–192)

## 2023-12-15 LAB — FOLATE: Folate: 5.9 ng/mL — ABNORMAL LOW (ref >5.9)

## 2023-12-15 LAB — VITAMIN B12: Vitamin B-12: 276 pg/mL (ref 180–914)

## 2023-12-15 MED ORDER — OXYCODONE HCL 5 MG PO TABS
5.0000 mg | ORAL_TABLET | ORAL | Status: DC | PRN
  Administered 2023-12-15 – 2023-12-18 (×9): 5 mg via ORAL
  Filled 2023-12-15 (×10): qty 1

## 2023-12-15 MED ORDER — ONDANSETRON HCL 4 MG PO TABS: 4.0000 mg | ORAL_TABLET | Freq: Four times a day (QID) | ORAL | Status: DC | PRN

## 2023-12-15 MED ORDER — ACETAMINOPHEN 650 MG RE SUPP: 650.0000 mg | Freq: Four times a day (QID) | RECTAL | Status: DC | PRN

## 2023-12-15 MED ORDER — MORPHINE SULFATE (PF) 4 MG/ML IV SOLN
4.0000 mg | Freq: Once | INTRAVENOUS | Status: AC
  Administered 2023-12-15: 4 mg via INTRAVENOUS
  Filled 2023-12-15: qty 1

## 2023-12-15 MED ORDER — ONDANSETRON HCL 4 MG/2ML IJ SOLN: 4.0000 mg | Freq: Four times a day (QID) | INTRAMUSCULAR | Status: DC | PRN

## 2023-12-15 MED ORDER — ACETAMINOPHEN 325 MG PO TABS: 650.0000 mg | ORAL_TABLET | Freq: Four times a day (QID) | ORAL | Status: DC | PRN

## 2023-12-15 NOTE — H&P (Signed)
 History and Physical    Deanna Anderson DOB: 12/16/1970 DOA: 12/14/2023  PCP: Department, [facility]   Chief Complaint:  abd pain  HPI: Deanna Anderson is a 53 y.o. female with no medical history who presented with right upper quadrant abdominal pain.  Patient was having worsening pain with eating and has noticed easy bruising over the last several weeks.  She developed some shortness of breath and her symptoms felt improved so she presented to the ER.  On arrival to emergency department she was afebrile and hemodynamically stable.  Labs were obtained which showed bicarb 19, creatinine 0.54, AST 42, ALT 62, WBC 3.1, hemoglobin 6.9, platelets 17,000.  INR 1.1.  Reticulocyte count 26.6, LDH 268.  Hematology was contacted in emergency department and recommended CT abdomen pelvis which showed no acute findings.  Patient was admitted for further hematologic evaluation.  Patient denies infectious complaints.   Review of Systems: Review of Systems  Constitutional:  Positive for malaise/fatigue. Negative for chills and fever.  HENT: Negative.    Eyes: Negative.   Respiratory: Negative.    Cardiovascular: Negative.   Gastrointestinal: Negative.   Genitourinary: Negative.   Musculoskeletal: Negative.   Skin: Negative.   Neurological: Negative.   Endo/Heme/Allergies: Negative.   Psychiatric/Behavioral: Negative.       As per HPI otherwise 10 point review of systems negative.   No Known Allergies  History reviewed. No pertinent past medical history.  History reviewed. No pertinent surgical history.   reports that she has never smoked. She has never used smokeless tobacco. She reports that she does not drink alcohol and does not use drugs.  Family History  Problem Relation Age of Onset   Breast cancer Neg Hx     Prior to Admission medications   Medication Sig Start Date End Date Taking? Authorizing Provider  calcium carbonate (OS-CAL) 1250 (500  Ca) MG chewable tablet Chew 1 tablet by mouth daily.    [provider]  Multiple Vitamin (MULTIVITAMIN) capsule Take 1 capsule by mouth daily. Patient not taking: Reported on 06/09/2023    [provider]    Physical Exam: Vitals:   12/15/23 0145 12/15/23 0232 12/15/23 0407 12/15/23 0442  BP: 122/72  121/69 124/70  Pulse: 85 74 75 73  Resp: (!) 23 11 18 18   Temp: 98.1 F (36.7 C)  98.6 F (37 C) 99 F (37.2 C)  TempSrc:   Oral Oral  SpO2: 100% 98% 100% 100%  Weight:      Height:       Physical Exam Constitutional:      Appearance: She is normal weight.  HENT:     Head: Normocephalic.     Mouth/Throat:     Mouth: Mucous membranes are moist.  Eyes:     Extraocular Movements: Extraocular movements intact.  Cardiovascular:     Rate and Rhythm: Normal rate and regular rhythm.     Heart sounds: Normal heart sounds.  Pulmonary:     Effort: Pulmonary effort is normal.  Abdominal:     General: Abdomen is flat.     Palpations: Abdomen is soft.  Skin:    Capillary Refill: Capillary refill takes less than 2 seconds.  Neurological:     General: No focal deficit present.     Mental Status: She is alert.  Psychiatric:        Mood and Affect: Mood normal.       Labs on Admission: I have personally reviewed the patients's labs and  imaging studies.  Assessment/Plan Principal Problem:   Pancytopenia (HCC)   # Pancytopenia, unclear etiology - Broad differential including acute leukemia - CT scan without splenomegaly - No infectious complaints  Plan: Trend labs Monitor neutropenia Consult hematology    Admission status: Observation Med-Surg  Certification: The appropriate patient status for this patient is OBSERVATION. Observation status is judged to be reasonable and necessary in order to provide the required intensity of service to ensure the patient's safety. The patient's presenting symptoms, physical exam findings, and initial radiographic and  laboratory data in the context of their medical condition is felt to place them at decreased risk for further clinical deterioration. Furthermore, it is anticipated that the patient will be medically stable for discharge from the hospital within 2 midnights of admission.     Deanna Dess MD [facility] If 7PM-7AM, please contact night-coverage [email or web address]  12/15/2023, 6:39 AM

## 2023-12-15 NOTE — Plan of Care (Signed)
   Problem: Activity: Goal: Risk for activity intolerance will decrease Outcome: Progressing   Problem: Coping: Goal: Level of anxiety will decrease Outcome: Progressing

## 2023-12-15 NOTE — Progress Notes (Signed)
 Patient seen and evaluated, chart reviewed, please see EMR for updated orders. Please see full H&P dictated by admitting physician Dr. Dena for same date of service.   Brief Summary 53 y.o. female with no medical history who presented with right upper quadrant abdominal pain admitted on 12/15/2023 with pancytopenia and RUQ pain  A/p 1)Pancytopenia --?? Etiology -Pt is a non-smoker -No fever or concerns about infection -CT abdomen and pelvis with mild hepatic steatosis however spleen was unremarkable -Discussed with hematologist Dr. Rogers who recommends bone marrow biopsy -per Pommier Wyatt-(radiology PA)--plan is for IR bone marrow biopsy on 12/16/2023 -Pt's son at bedside is translating from [place] to [place] and vice versa -Acute leukemia is high on the list of differentials - Patient discussed with patient's husband at bedside   Patient seen and evaluated, chart reviewed, please see EMR for updated orders. Please see full H&P dictated by admitting physician Dr. Dena for same date of service.   - Total care time 43 minutes  Rendall Carwin, MD

## 2023-12-15 NOTE — Consult Note (Shared)
 Chief Complaint: Pancytopenia - IR consulted for image guided bone marrow biopsy  Referring Provider(s): Pearlean Manus, MD   Supervising Physician: Jennefer Rover  Patient Status: [facility] - In-pt  History of Present Illness: Deanna Anderson is a 53 y.o. female with no pmhx. Presented to [facility] ED yesterday, 12/14/23 with RUQ abd pain x1 day. Also noted additional bruising x2 weeks. Labs obtained in the ED show pancytopenia. bicarb 19, creatinine 0.54, AST 42, ALT 62, WBC 3.1, hemoglobin 6.9, platelets 17,000. CT abd/pelvis from ED unremarkable. Hematology consulted who requests bone marrow biopsy for further diagnostic workup.  Pt to be transferred to [facility] IR department for procedure tomorrow morning, 12/16/23.  *** Patient is Full Code  History reviewed. No pertinent past medical history.  History reviewed. No pertinent surgical history.  Allergies: Patient has no known allergies.  Medications: Prior to Admission medications   Medication Sig Start Date End Date Taking? Authorizing Provider  calcium carbonate (OS-CAL) 1250 (500 Ca) MG chewable tablet Chew 1 tablet by mouth daily.    [provider]  Multiple Vitamin (MULTIVITAMIN) capsule Take 1 capsule by mouth daily. Patient not taking: Reported on 06/09/2023    [provider]     Family History  Problem Relation Age of Onset   Breast cancer Neg Hx     Social History   Socioeconomic History   Marital status: Married    Spouse name: Not on file   Number of children: 3   Years of education: Not on file   Highest education level: 6th grade  Occupational History   Not on file  Tobacco Use   Smoking status: Never   Smokeless tobacco: Never  Vaping Use   Vaping status: Never Used  Substance and Sexual Activity   Alcohol use: No   Drug use: No   Sexual activity: Yes    Partners: Male    Birth control/protection: Injection  Other Topics Concern   Not on file  Social History  Narrative   Pt was born in [place]   Social Drivers of Health   Financial Resource Strain: Not on file  Food Insecurity: No Food Insecurity (12/15/2023)   Hunger Vital Sign    Worried About Running Out of Food in the Last Year: Never true    Ran Out of Food in the Last Year: Never true  Transportation Needs: No Transportation Needs (12/15/2023)   PRAPARE - Administrator, Civil Service (Medical): No    Lack of Transportation (Non-Medical): No  Physical Activity: Not on file  Stress: Not on file  Social Connections: Not on file     Review of Systems: A 12 point ROS discussed and pertinent positives are indicated in the HPI above.  All other systems are negative.  Review of Systems  Vital Signs: BP 124/70   Pulse 73   Temp 99 F (37.2 C) (Oral)   Resp 18   Ht 5' 1 (1.549 m)   Wt 176 lb 5.9 oz (80 kg)   SpO2 100%   BMI 33.32 kg/m   Advance Care Plan: The advanced care place/surrogate decision maker was discussed at the time of visit and the patient did not wish to discuss or was not able to name a surrogate decision maker or provide an advance care plan.  Physical Exam  Imaging: CT ABDOMEN PELVIS W CONTRAST Result Date: 12/14/2023 CLINICAL DATA:  Acute nonlocalized abdominal EXAM: CT ABDOMEN AND PELVIS WITH CONTRAST TECHNIQUE: Multidetector CT imaging  of the abdomen and pelvis was performed using the standard protocol following bolus administration of intravenous contrast. RADIATION DOSE REDUCTION: This exam was performed according to the departmental dose-optimization program which includes automated exposure control, adjustment of the mA and/or kV according to patient size and/or use of iterative reconstruction technique. CONTRAST:  100mL OMNIPAQUE  IOHEXOL  300 MG/ML  SOLN COMPARISON:  None Available. FINDINGS: Lower chest: No acute abnormality. Hepatobiliary: Mild hepatic steatosis. No enhancing intrahepatic mass. No intra or extrahepatic biliary ductal dilation.  Gallbladder unremarkable. Pancreas: Unremarkable Spleen: Unremarkable Adrenals/Urinary Tract: Adrenal glands are unremarkable. Kidneys are normal, without renal calculi, focal lesion, or hydronephrosis. Bladder is unremarkable. Stomach/Bowel: Stomach is within normal limits. Appendix appears normal. No evidence of bowel wall thickening, distention, or inflammatory changes. Vascular/Lymphatic: No significant vascular findings are present. No enlarged abdominal or pelvic lymph nodes. Reproductive: Uterus and bilateral adnexa are unremarkable. Other: No abdominal wall hernia or abnormality. No abdominopelvic ascites. Musculoskeletal: Remote T11 superior endplate fracture with mild loss of height and no retropulsion. No acute bone abnormality. No lytic or blastic bone lesion. IMPRESSION: 1. No acute intra-abdominal pathology identified. 2. Mild hepatic steatosis. 3. Remote T11 superior endplate fracture. Electronically Signed   By: Dorethia Molt M.D.   On: 12/14/2023 21:52    Labs:  CBC: Recent Labs    12/14/23 1946 12/14/23 2235 12/15/23 0611  WBC 3.1* 2.8*  --   HGB 6.9* 6.3* 9.2*  HCT 21.0* 18.6* 26.6*  PLT 17* 15*  --     COAGS: Recent Labs    12/14/23 2235  INR 1.1  APTT 27    BMP: Recent Labs    12/14/23 1946  NA 137  K 3.9  CL 108  CO2 19*  GLUCOSE 114*  BUN 15  CALCIUM 8.8*  CREATININE 0.54  GFRNONAA >60    LIVER FUNCTION TESTS: Recent Labs    12/14/23 1946  BILITOT 1.0  AST 42*  ALT 62*  ALKPHOS 142*  PROT 7.7  ALBUMIN 4.0    TUMOR MARKERS: No results for input(s): AFPTM, CEA, CA199, CHROMGRNA in the last 8760 hours.  Assessment and Plan:  Deanna Anderson is a 53 y.o. female with no pmhx. Presented to [facility] ED yesterday, 12/14/23 with RUQ abd pain x1 day. Also noted additional bruising x2 weeks. Labs obtained in the ED show pancytopenia. bicarb 19, creatinine 0.54, AST 42, ALT 62, WBC 3.1, hemoglobin 6.9, platelets 17,000. CT  abd/pelvis from ED unremarkable. Hematology consulted who requests bone marrow biopsy for further diagnostic workup.  Pt to be transferred to [facility] IR department for procedure tomorrow morning, 12/16/23.  Risks and benefits of bone marrow biopsy was discussed with the patient and/or patient's family including, but not limited to bleeding, infection, damage to adjacent structures or low yield requiring additional tests.  All of the questions were answered and there is agreement to proceed.  Consent signed and in chart.   Thank you for allowing our service to participate in Raymie Trani 's care.  Electronically Signed: Kimble VEAR Clas, PA-C   12/15/2023, 12:00 PM      I spent a total of {New PWEU:[id number]} {New Out-Pt:[id number]}  {Established Out-Pt:[id number]} in face to face in clinical consultation, greater than 50% of which was counseling/coordinating care for  bone marrow biopsy.

## 2023-12-15 NOTE — Plan of Care (Signed)

## 2023-12-15 NOTE — Consult Note (Signed)
 [facility] Consultation Oncology  Name: Deanna Anderson      MRN: [id number]    Location: A318/A318-01  Date: 12/15/2023 Time:4:05 PM   REFERRING PHYSICIAN: Dr. Melvenia  REASON FOR CONSULT: Pancytopenia   DIAGNOSIS: Pancytopenia, highly suspicious for leukemia  HISTORY OF PRESENT ILLNESS: Ms. Gladis is a 53 year old Hispanic female seen in consultation today at the request of Dr. Melvenia for pancytopenia.  She developed a right upper quadrant pain, worse on deep inspiration and certain movements on Sunday.  It got worse on Monday when she presented to the ER.  She also noticed easy bruising on her lower extremities for the last 2 weeks.  No recent infections including tick bites.  No recent use of antibiotics.  Denies any fevers, night sweats or weight loss in the last 6 months.  Her son and husband at bedside.  She had reportedly blood work done approximately 2 months ago by her primary doctor and was told everything was normal.  She is not on any regular prescription medications.  She takes multivitamin and ibuprofen as needed for neck pain.  Denies any history of cigarette smoking or chemical exposure.  Family history significant for paternal uncle with head and neck cancer.  Paternal cousin had leukemia.  PAST MEDICAL HISTORY:   History reviewed. No pertinent past medical history.  ALLERGIES: No Known Allergies    MEDICATIONS: I have reviewed the patient's current medications.     PAST SURGICAL HISTORY History reviewed. No pertinent surgical history.  FAMILY HISTORY: Family History  Problem Relation Age of Onset   Breast cancer Neg Hx     SOCIAL HISTORY:  reports that she has never smoked. She has never used smokeless tobacco. She reports that she does not drink alcohol and does not use drugs.  PERFORMANCE STATUS: The patient's performance status is 1 - Symptomatic but completely ambulatory  PHYSICAL EXAM: Most Recent Vital Signs: Blood pressure 122/73, pulse 66,  temperature 98.2 F (36.8 C), temperature source Oral, resp. rate 18, height 5' 1 (1.549 m), weight 176 lb 5.9 oz (80 kg), SpO2 100%. BP 122/73 (BP Location: Left Arm)   Pulse 66   Temp 98.2 F (36.8 C) (Oral)   Resp 18   Ht 5' 1 (1.549 m)   Wt 176 lb 5.9 oz (80 kg)   SpO2 100%   BMI 33.32 kg/m  General appearance: alert, cooperative, and appears stated age Lungs: clear to auscultation bilaterally Heart: regular rate and rhythm Abdomen: Soft, no tenderness in the right lower ribs anteriorly.  No palpable hepatosplenomegaly. Extremities: No edema Skin: Ecchymosis on the lower extremities. Lymph nodes: Cervical, supraclavicular, and axillary nodes normal.  LABORATORY DATA:  Results for orders placed or performed during the hospital encounter of 12/14/23 (from the past 48 hours)  ABO/Rh     Status: None   Collection Time: 12/14/23  7:45 PM  Result Value Ref Range   ABO/RH(D)      A POS Performed at [facility], [address] [place], [place] [address]   Lipase, blood     Status: None   Collection Time: 12/14/23  7:46 PM  Result Value Ref Range   Lipase 44 11 - 51 U/L    Comment: Performed at [facility], [address] [place], [place] [address]  Comprehensive metabolic panel     Status: Abnormal   Collection Time: 12/14/23  7:46 PM  Result Value Ref Range   Sodium 137 135 - 145 mmol/L   Potassium 3.9 3.5 -  5.1 mmol/L   Chloride 108 98 - 111 mmol/L   CO2 19 (L) 22 - 32 mmol/L   Glucose, Bld 114 (H) 70 - 99 mg/dL    Comment: Glucose reference range applies only to samples taken after fasting for at least 8 hours.   BUN 15 6 - 20 mg/dL   Creatinine, Ser 9.45 0.44 - 1.00 mg/dL   Calcium 8.8 (L) 8.9 - 10.3 mg/dL   Total Protein 7.7 6.5 - 8.1 g/dL   Albumin 4.0 3.5 - 5.0 g/dL   AST 42 (H) 15 - 41 U/L   ALT 62 (H) 0 - 44 U/L   Alkaline Phosphatase 142 (H) 38 - 126 U/L   Total Bilirubin 1.0 0.0 - 1.2 mg/dL   GFR, Estimated >39 >39 mL/min    Comment: (NOTE) Calculated  using the CKD-EPI Creatinine Equation (2021)    Anion gap 10 5 - 15    Comment: Performed at [facility], [address] [place], [place] [address]  CBC     Status: Abnormal   Collection Time: 12/14/23  7:46 PM  Result Value Ref Range   WBC 3.1 (L) 4.0 - 10.5 K/uL   RBC 2.43 (L) 3.87 - 5.11 MIL/uL   Hemoglobin 6.9 (LL) 12.0 - 15.0 g/dL    Comment: REPEATED TO VERIFY THIS CRITICAL RESULT HAS VERIFIED AND BEEN CALLED TO M TELLER,RN BY MARIE KELLY ON 07 28 2025 AT 2016, AND HAS BEEN READ BACK. REPEATED TO VERIFY    HCT 21.0 (L) 36.0 - 46.0 %   MCV 86.4 80.0 - 100.0 fL   MCH 28.4 26.0 - 34.0 pg   MCHC 32.9 30.0 - 36.0 g/dL   RDW 85.5 88.4 - 84.4 %   Platelets 17 (LL) 150 - 400 K/uL    Comment: SPECIMEN CHECKED FOR CLOTS Immature Platelet Fraction may be clinically indicated, consider ordering this additional test [id number] THIS CRITICAL RESULT HAS VERIFIED AND BEEN CALLED TO M TELLER,RN BY MARIE KELLY ON 07 28 2025 AT 2016, AND HAS BEEN READ BACK. REPEATED TO VERIFY    nRBC 2.6 (H) 0.0 - 0.2 %    Comment: Performed at [facility], [address] [place], [place] [address]  Type and screen [facility]     Status: None (Preliminary result)   Collection Time: 12/14/23  9:01 PM  Result Value Ref Range   ABO/RH(D) A POS    Antibody Screen NEG    Sample Expiration 12/17/2023,2359    Unit Number [id number]    Blood Component Type RBC LR PHER2    Unit division 00    Status of Unit ISSUED    Transfusion Status OK TO TRANSFUSE    Crossmatch Result Compatible    Unit Number [id number]    Blood Component Type RED CELLS,LR    Unit division 00    Status of Unit ISSUED,FINAL    Transfusion Status OK TO TRANSFUSE    Crossmatch Result      Compatible Performed at [facility], [address], [place], [place] [address]   Prepare RBC (crossmatch)     Status: None   Collection Time: 12/14/23  9:01 PM  Result Value Ref Range   Order Confirmation      ORDER PROCESSED BY  BLOOD BANK Performed at [facility], [address], [place], [place] [address]   POC occult blood, ED     Status: None   Collection Time: 12/14/23  9:15 PM  Result Value Ref Range  Fecal Occult Blood Negative   Protime-INR     Status: None   Collection Time: 12/14/23 10:35 PM  Result Value Ref Range   Prothrombin Time 14.5 11.4 - 15.2 seconds   INR 1.1 0.8 - 1.2    Comment: (NOTE) INR goal varies based on device and disease states. Performed at [facility], [address] [place], [place] [address]   APTT     Status: None   Collection Time: 12/14/23 10:35 PM  Result Value Ref Range   aPTT 27 24 - 36 seconds    Comment: Performed at [facility], [address], [place], [place] [address]  Fibrinogen      Status: None   Collection Time: 12/14/23 10:35 PM  Result Value Ref Range   Fibrinogen  460 210 - 475 mg/dL    Comment: (NOTE) Fibrinogen  results may be underestimated in patients receiving thrombolytic therapy. Performed at [facility], [address], [place], [place] [address]   CBC with Differential     Status: Abnormal   Collection Time: 12/14/23 10:35 PM  Result Value Ref Range   WBC 2.8 (L) 4.0 - 10.5 K/uL   RBC 2.18 (L) 3.87 - 5.11 MIL/uL   Hemoglobin 6.3 (LL) 12.0 - 15.0 g/dL    Comment: CRITICAL VALUE NOTED.  VALUE IS CONSISTENT WITH PREVIOUSLY REPORTED AND CALLED VALUE. REPEATED TO VERIFY    HCT 18.6 (L) 36.0 - 46.0 %   MCV 85.3 80.0 - 100.0 fL   MCH 28.9 26.0 - 34.0 pg   MCHC 33.9 30.0 - 36.0 g/dL   RDW 85.5 88.4 - 84.4 %   Platelets 15 (LL) 150 - 400 K/uL    Comment: SPECIMEN CHECKED FOR CLOTS Immature Platelet Fraction may be clinically indicated, consider ordering this additional test [id number] CRITICAL VALUE NOTED.  VALUE IS CONSISTENT WITH PREVIOUSLY REPORTED AND CALLED VALUE.    nRBC 2.5 (H) 0.0 - 0.2 %   Neutrophils Relative % 29 %   Neutro Abs 1.0 (L) 1.7 - 7.7 K/uL   Band Neutrophils 5 %   Lymphocytes Relative 53 %   Lymphs Abs 1.5 0.7 -  4.0 K/uL   Monocytes Relative 3 %   Monocytes Absolute 0.1 0.1 - 1.0 K/uL   Eosinophils Relative 2 %   Eosinophils Absolute 0.1 0.0 - 0.5 K/uL   Basophils Relative 2 %   Basophils Absolute 0.1 0.0 - 0.1 K/uL   Metamyelocytes Relative 3 %   Myelocytes 1 %   Promyelocytes Relative 2 %   Abs Immature Granulocytes 0.20 (H) 0.00 - 0.07 K/uL   Polychromasia PRESENT     Comment: Performed at [facility], [address], [place], [place] [address]  Reticulocytes     Status: Abnormal   Collection Time: 12/14/23 10:35 PM  Result Value Ref Range   Retic Ct Pct 1.2 0.4 - 3.1 %   RBC. 2.18 (L) 3.87 - 5.11 MIL/uL   Retic Count, Absolute 26.6 19.0 - 186.0 K/uL   Immature Retic Fract 17.1 (H) 2.3 - 15.9 %    Comment: Performed at [facility], [address] [place], [place] [address]  Lactate dehydrogenase     Status: Abnormal   Collection Time: 12/14/23 10:35 PM  Result Value Ref Range   LDH 268 (H) 98 - 192 U/L    Comment: Performed at [facility], [address], [place], [place] [address]  Vitamin B12     Status: None   Collection Time: 12/14/23 10:35 PM  Result Value  Ref Range   Vitamin B-12 276 180 - 914 pg/mL    Comment: (NOTE) This assay is not validated for testing neonatal or myeloproliferative syndrome specimens for Vitamin B12 levels. Performed at [facility], [address], [place], [place] [address]   Folate, serum, performed at [facility] lab     Status: Abnormal   Collection Time: 12/14/23 10:35 PM  Result Value Ref Range   Folate 5.9 (L) >5.9 ng/mL    Comment: Performed at [facility], [address], [place], [place] [address]  Uric acid     Status: None   Collection Time: 12/14/23 10:35 PM  Result Value Ref Range   Uric Acid, Serum 5.5 2.5 - 7.1 mg/dL    Comment: Performed at [facility], [address], [place], [place] [address]  Direct antiglobulin test     Status: None   Collection Time: 12/15/23  2:30 AM  Result Value Ref Range   DAT, complement       NEG Performed at [facility], [address], [place], [place] [address]    DAT, IgG      NEG Performed at [facility], [address], [place], [place] [address]   Urinalysis, Routine w reflex microscopic -Urine, Clean Catch     Status: Abnormal   Collection Time: 12/15/23  5:00 AM  Result Value Ref Range   Color, Urine YELLOW YELLOW   APPearance CLEAR CLEAR   Specific Gravity, Urine 1.045 (H) 1.005 - 1.030   pH 5.0 5.0 - 8.0   Glucose, UA NEGATIVE NEGATIVE mg/dL   Hgb urine dipstick SMALL (A) NEGATIVE   Bilirubin Urine NEGATIVE NEGATIVE   Ketones, ur NEGATIVE NEGATIVE mg/dL   Protein, ur NEGATIVE NEGATIVE mg/dL   Nitrite POSITIVE (A) NEGATIVE   Leukocytes,Ua TRACE (A) NEGATIVE   RBC / HPF 0-5 0 - 5 RBC/hpf   WBC, UA 11-20 0 - 5 WBC/hpf   Bacteria, UA RARE (A) NONE SEEN   Squamous Epithelial / HPF 0-5 0 - 5 /HPF   Mucus PRESENT     Comment: Performed at [facility], [address], [place], [place] [address]  Hemoglobin and hematocrit, blood     Status: Abnormal   Collection Time: 12/15/23  6:11 AM  Result Value Ref Range   Hemoglobin 9.2 (L) 12.0 - 15.0 g/dL    Comment: REPEATED TO VERIFY POST TRANSFUSION SPECIMEN    HCT 26.6 (L) 36.0 - 46.0 %    Comment: Performed at [facility], [address], [place], [place] [address]      RADIOGRAPHY: CT ABDOMEN PELVIS W CONTRAST Result Date: 12/14/2023 CLINICAL DATA:  Acute nonlocalized abdominal EXAM: CT ABDOMEN AND PELVIS WITH CONTRAST TECHNIQUE: Multidetector CT imaging of the abdomen and pelvis was performed using the standard protocol following bolus administration of intravenous contrast. RADIATION DOSE REDUCTION: This exam was performed according to the departmental dose-optimization program which includes automated exposure control, adjustment of the mA and/or kV according to patient size and/or use of iterative reconstruction technique. CONTRAST:  OMNIPAQUE  IOHEXOL  300 MG/ML  SOLN COMPARISON:  None Available.  FINDINGS: Lower chest: No acute abnormality. Hepatobiliary: Mild hepatic steatosis. No enhancing intrahepatic mass. No intra or extrahepatic biliary ductal dilation. Gallbladder unremarkable. Pancreas: Unremarkable Spleen: Unremarkable Adrenals/Urinary Tract: Adrenal glands are unremarkable. Kidneys are normal, without renal calculi, focal lesion, or hydronephrosis. Bladder is unremarkable. Stomach/Bowel: Stomach is within normal limits. Appendix appears normal. No evidence of bowel wall thickening, distention, or inflammatory changes. Vascular/Lymphatic: No  significant vascular findings are present. No enlarged abdominal or pelvic lymph nodes. Reproductive: Uterus and bilateral adnexa are unremarkable. Other: No abdominal wall hernia or abnormality. No abdominopelvic ascites. Musculoskeletal: Remote T11 superior endplate fracture with mild loss of height and no retropulsion. No acute bone abnormality. No lytic or blastic bone lesion. IMPRESSION: 1. No acute intra-abdominal pathology identified. 2. Mild hepatic steatosis. 3. Remote T11 superior endplate fracture. Electronically Signed   By: Dorethia Molt M.D.   On: 12/14/2023 21:52        ASSESSMENT and PLAN:  1.  Pancytopenia: - Presentation with right lower anterior rib pain, on deep inspiration and certain movements, started on Sunday.  Also had easy bruising on the lower extremities for 2 weeks. - CBC in the ER on 12/14/2023: White count 3.1, PLT-17, Hb-6.9.  MCV 86 - Repeat CBCD showed PLT-15, WBC-2.8, Hb-6.3.  LDH was elevated at 268.  Uric acid was normal.  B12 was normal.  Folic acid was mildly low at 5.9. - Peripheral blood smear reviewed by me showed thrombocytopenia and leukopenia with left side shift.  Occasional nucleated RBC. - Direct Coombs test is negative. - She received 2 units PRBC with improvement of hemoglobin to 9.2. - We have sent peripheral blood flow cytometry, results pending.  Unfortunately I do not have any of prior CBC to  compare with. - I have recommended further workup with bone marrow aspiration and biopsy to evaluate for acute leukemia and other bone marrow infiltrative process.  We discussed the procedure in detail.  She is agreeable.  We will try to arrange it through IR. - Discussed with Dr. Rendall.  All questions were answered. The patient knows to call the clinic with any problems, questions or concerns. We can certainly see the patient much sooner if necessary.    Svea Pusch

## 2023-12-15 NOTE — ED Notes (Addendum)
 Paper consent for blood signed per pt and family members.

## 2023-12-15 NOTE — TOC CM/SW Note (Signed)
 Transition of Care [facility]) - Inpatient Brief Assessment   Patient Details  Name: Deanna Anderson MRN: [id number] Date of Birth: 03/15/1971  Transition of Care [facility]) CM/SW Contact:    Lucie Lunger, LCSWA Phone Number: 12/15/2023, 10:31 AM   Clinical Narrative: Transition of Care Department [facility]) has reviewed patient and no TOC needs have been identified at this time. We will continue to monitor patient advancement through interdiciplinary progression rounds. If new patient transition needs arise, please place a TOC consult.  Transition of Care Asessment: Insurance and Status: Selfpay Patient has primary care physician: Yes Home environment has been reviewed: From home Prior level of function:: Independent Prior/Current Home Services: No current home services Social Drivers of Health Review: SDOH reviewed no interventions necessary Readmission risk has been reviewed: Yes Transition of care needs: no transition of care needs at this time

## 2023-12-16 LAB — COMPREHENSIVE METABOLIC PANEL WITH GFR
ALT: 45 U/L — ABNORMAL HIGH (ref 0–44)
AST: 28 U/L (ref 15–41)
Albumin: 3.5 g/dL (ref 3.5–5.0)
Alkaline Phosphatase: 114 U/L (ref 38–126)
Anion gap: 10 (ref 5–15)
BUN: 14 mg/dL (ref 6–20)
CO2: 20 mmol/L — ABNORMAL LOW (ref 22–32)
Calcium: 8.8 mg/dL — ABNORMAL LOW (ref 8.9–10.3)
Chloride: 108 mmol/L (ref 98–111)
Creatinine, Ser: 0.51 mg/dL (ref 0.44–1.00)
GFR, Estimated: >60 mL/min (ref >60)
Glucose, Bld: 114 mg/dL — ABNORMAL HIGH (ref 70–99)
Potassium: 4.2 mmol/L (ref 3.5–5.1)
Sodium: 138 mmol/L (ref 135–145)
Total Bilirubin: 0.4 mg/dL (ref 0.0–1.2)
Total Protein: 7.1 g/dL (ref 6.5–8.1)

## 2023-12-16 LAB — TYPE AND SCREEN
ABO/RH(D): A POS
Antibody Screen: NEGATIVE
Unit division: 0
Unit division: 0

## 2023-12-16 LAB — CBC WITH DIFFERENTIAL/PLATELET
Abs Immature Granulocytes: 0.4 K/uL — ABNORMAL HIGH (ref 0.00–0.07)
Band Neutrophils: 5 %
Basophils Absolute: 0.1 K/uL (ref 0.0–0.1)
Basophils Absolute: 0 K/uL (ref 0.0–0.1)
Basophils Relative: 2 %
Basophils Relative: 0 %
Blasts: 6 %
Eosinophils Absolute: 0.1 K/uL (ref 0.0–0.5)
Eosinophils Absolute: 0 K/uL (ref 0.0–0.5)
Eosinophils Relative: 2 %
Eosinophils Relative: 0 %
HCT: 18.6 % — ABNORMAL LOW (ref 36.0–46.0)
HCT: 28.8 % — ABNORMAL LOW (ref 36.0–46.0)
Hemoglobin: 6.3 g/dL — CL (ref 12.0–15.0)
Hemoglobin: 9.8 g/dL — ABNORMAL LOW (ref 12.0–15.0)
Lymphocytes Relative: 53 %
Lymphocytes Relative: 57 %
Lymphs Abs: 1.5 K/uL (ref 0.7–4.0)
Lymphs Abs: 2.1 K/uL (ref 0.7–4.0)
MCH: 28.9 pg (ref 26.0–34.0)
MCH: 28.9 pg (ref 26.0–34.0)
MCHC: 33.9 g/dL (ref 30.0–36.0)
MCHC: 34 g/dL (ref 30.0–36.0)
MCV: 85.3 fL (ref 80.0–100.0)
MCV: 85 fL (ref 80.0–100.0)
Metamyelocytes Relative: 4 %
Monocytes Absolute: 0.1 K/uL (ref 0.1–1.0)
Monocytes Absolute: 0 K/uL — ABNORMAL LOW (ref 0.1–1.0)
Monocytes Relative: 3 %
Monocytes Relative: 1 %
Myelocytes: 4 %
Neutro Abs: 1 K/uL — AB (ref 1.7–7.7)
Neutro Abs: 1 K/uL — ABNORMAL LOW (ref 1.7–7.7)
Neutrophils Relative %: 29 %
Neutrophils Relative %: 26 %
Other: 0.2 K/uL — AB (ref 0.00–0.07)
Platelets: 15 K/uL — CL (ref 150–400)
Platelets: 14 K/uL — CL (ref 150–400)
Promyelocytes Relative: 2 %
RBC Morphology: 1
RBC: 2.18 MIL/uL — ABNORMAL LOW (ref 3.87–5.11)
RBC: 3.39 MIL/uL — ABNORMAL LOW (ref 3.87–5.11)
RDW: 14.4 % (ref 11.5–15.5)
RDW: 16 % — ABNORMAL HIGH (ref 11.5–15.5)
Smear Review: 2
WBC Morphology: 3
WBC: 2.8 K/uL — ABNORMAL LOW (ref 4.0–10.5)
WBC: 3.7 K/uL — ABNORMAL LOW (ref 4.0–10.5)
nRBC: 2.5 % — ABNORMAL HIGH (ref 0.0–0.2)
nRBC: 4.1 % — ABNORMAL HIGH (ref 0.0–0.2)

## 2023-12-16 LAB — CBC
HCT: 21 % — ABNORMAL LOW (ref 36.0–46.0)
Hemoglobin: 6.9 g/dL — CL (ref 12.0–15.0)
MCH: 28.4 pg (ref 26.0–34.0)
MCHC: 32.9 g/dL (ref 30.0–36.0)
MCV: 86.4 fL (ref 80.0–100.0)
Platelets: 17 K/uL — CL (ref 150–400)
RBC: 2.43 MIL/uL — ABNORMAL LOW (ref 3.87–5.11)
RDW: 14.4 % (ref 11.5–15.5)
WBC: 3.1 K/uL — ABNORMAL LOW (ref 4.0–10.5)
nRBC: 2.6 % — ABNORMAL HIGH (ref 0.0–0.2)

## 2023-12-16 LAB — BPAM RBC
Blood Product Expiration Date: 202508222359
ISSUE DATE / TIME: 202507282226
ISSUE DATE / TIME: 202507290120
ISSUE DATE / TIME: 202508222359
Unit Type and Rh: 202508222359
Unit Type and Rh: 202508222359
Unit Type and Rh: 6200
Unit Type and Rh: 6200

## 2023-12-16 LAB — PROTIME-INR
INR: 1 (ref 0.8–1.2)
Prothrombin Time: 14.1 s (ref 11.4–15.2)

## 2023-12-16 LAB — HIV ANTIBODY (ROUTINE TESTING W REFLEX): HIV Screen 4th Generation wRfx: NONREACTIVE

## 2023-12-16 LAB — HAPTOGLOBIN: Haptoglobin: 155 mg/dL (ref 33–346)

## 2023-12-16 MED ORDER — METHOCARBAMOL 500 MG PO TABS: 750.0000 mg | ORAL_TABLET | Freq: Three times a day (TID) | ORAL | Status: DC | PRN

## 2023-12-16 NOTE — Plan of Care (Signed)
   Problem: Education: Goal: Knowledge of General Education information will improve Description Including pain rating scale, medication(s)/side effects and non-pharmacologic comfort measures Outcome: Progressing   Problem: Health Behavior/Discharge Planning: Goal: Ability to manage health-related needs will improve Outcome: Progressing

## 2023-12-16 NOTE — Plan of Care (Signed)
   Problem: Health Behavior/Discharge Planning: Goal: Ability to manage health-related needs will improve Outcome: Progressing

## 2023-12-16 NOTE — Progress Notes (Signed)
  Progress Note   Patient: Deanna Anderson FMW:[id number] DOB: 1971-02-15 DOA: 12/14/2023     1 DOS: the patient was seen and examined on 12/16/2023   Brief hospital admission narrative: As per H&P written by Dr. Dena on 12/15/2023 Deanna Anderson is a 53 y.o. female with no medical history who presented with right upper quadrant abdominal pain.  Patient was having worsening pain with eating and has noticed easy bruising over the last several weeks.  She developed some shortness of breath and her symptoms felt improved so she presented to the ER.  On arrival to emergency department she was afebrile and hemodynamically stable.  Labs were obtained which showed bicarb 19, creatinine 0.54, AST 42, ALT 62, WBC 3.1, hemoglobin 6.9, platelets 17,000.  INR 1.1.  Reticulocyte count 26.6, LDH 268.  Hematology was contacted in emergency department and recommended CT abdomen pelvis which showed no acute findings.  Patient was admitted for further hematologic evaluation.  Patient denies infectious complaints.   Assessment and plan 1-pancytopenia -Reporting easy bruising prior to admission - After blood transfusion WBCs 3.7, hemoglobin 9.8 and platelet count 14K - Appreciate assistance and recommendation by hematology/oncology - Planning for bone marrow biopsy/aspiration on 12/17/2023 (looking to rule out acute leukemia and any other infiltrative process). - No overt bleeding appreciated - Patient is hemodynamically stable.  2-class I obesity - Low-calorie diet and portion control discussed with patient. -Body mass index is 33.32 kg/m.    Subjective:  Afebrile, no chest pain, no shortness of breath.  Physical Exam: Vitals:   12/15/23 1255 12/15/23 1954 12/16/23 0543 12/16/23 1350  BP: 122/73 129/75 121/88 (!) 140/66  Pulse: 66 77 65 62  Resp:  18 (!) 24   Temp: 98.2 F (36.8 C) 99.4 F (37.4 C) 98.6 F (37 C) 97.8 F (36.6 C)  TempSrc: Oral Oral Oral Oral  SpO2: 100% 98% 96% 99%   Weight:      Height:       General exam: Alert, awake, oriented x 3 Respiratory system: Clear to auscultation. Respiratory effort normal. Cardiovascular system:RRR. No murmurs, rubs, gallops. Gastrointestinal system: Abdomen is obese, nondistended, soft and mildly tender to palpation in the right upper quadrant.  Pleuritic in nature. Central nervous system: Alert and oriented. No focal neurological deficits. Extremities: No C/C/E, +pedal pulses Skin: No rashes, lesions or ulcers Psychiatry: Judgement and insight appear normal. Mood & affect appropriate.    Data Reviewed: Comprehensive metabolic panel: Sodium 138, potassium 4.2, chloride 108, bicarb 20, BUN 14, creatinine 0.51, AST 28, ALT 45 and GFR >60  Family Communication: Husband and son at bedside.  Disposition: Status is: Inpatient Remains inpatient appropriate because: Continue ongoing workup for pancytopenia.   Time spent: 35 minutes  Author: Eric Nunnery, MD 12/16/2023 6:01 PM  For on call review [email or web address].

## 2023-12-16 NOTE — Progress Notes (Signed)
 Transportation arranged via [facility] for tomorrow am (12/17/2023) IR procedure at [facility]

## 2023-12-17 ENCOUNTER — Inpatient Hospital Stay (HOSPITAL_COMMUNITY)
Admit: 2023-12-17 | Discharge: 2023-12-17 | Disposition: A | Discharge status: Home / Self Care | Payer: Self-pay | Attending: Family Medicine | Admitting: Family Medicine

## 2023-12-17 HISTORY — PX: IR BONE MARROW BIOPSY: IMG5733

## 2023-12-17 LAB — CBC WITH DIFFERENTIAL/PLATELET
Abs Immature Granulocytes: 0.2 K/uL — ABNORMAL HIGH (ref 0.00–0.07)
Basophils Absolute: 0 K/uL (ref 0.0–0.1)
Basophils Relative: 0 %
Blasts: 2 %
Eosinophils Absolute: 0.2 K/uL (ref 0.0–0.5)
Eosinophils Relative: 5 %
HCT: 28.8 % — ABNORMAL LOW (ref 36.0–46.0)
Hemoglobin: 9.6 g/dL — ABNORMAL LOW (ref 12.0–15.0)
Lymphocytes Relative: 62 %
Lymphs Abs: 2.2 K/uL (ref 0.7–4.0)
MCH: 27.5 pg (ref 26.0–34.0)
MCHC: 33.3 g/dL (ref 30.0–36.0)
MCV: 82.5 fL (ref 80.0–100.0)
Metamyelocytes Relative: 3 %
Monocytes Absolute: 0 K/uL — ABNORMAL LOW (ref 0.1–1.0)
Monocytes Relative: 0 %
Myelocytes: 1 %
Neutro Abs: 0.9 K/uL — ABNORMAL LOW (ref 1.7–7.7)
Neutrophils Relative %: 26 %
Platelets: 14 K/uL — CL (ref 150–400)
Promyelocytes Relative: 1 %
RBC: 3.49 MIL/uL — ABNORMAL LOW (ref 3.87–5.11)
RDW: 15.6 % — ABNORMAL HIGH (ref 11.5–15.5)
WBC: 3.5 K/uL — ABNORMAL LOW (ref 4.0–10.5)
nRBC: 4.6 % — ABNORMAL HIGH (ref 0.0–0.2)

## 2023-12-17 LAB — COPPER, SERUM: Copper: 184 ug/dL — ABNORMAL HIGH (ref 80–158)

## 2023-12-17 LAB — METHYLMALONIC ACID, SERUM: Methylmalonic Acid, Quantitative: 110 nmol/L (ref 0–378)

## 2023-12-17 MED ORDER — LIDOCAINE-EPINEPHRINE 1 %-1:100000 IJ SOLN
20.0000 mL | Freq: Once | INTRAMUSCULAR | Status: AC
  Administered 2023-12-17: 8 mL via INTRADERMAL

## 2023-12-17 MED ORDER — SODIUM CHLORIDE 0.9% IV SOLUTION: Freq: Once | INTRAVENOUS | Status: AC

## 2023-12-17 MED ORDER — LIDOCAINE-EPINEPHRINE 1 %-1:100000 IJ SOLN
INTRAMUSCULAR | Status: AC
Start: 2023-12-17 — End: 2023-12-17
  Filled 2023-12-17: qty 1

## 2023-12-17 MED ORDER — FENTANYL CITRATE (PF) 100 MCG/2ML IJ SOLN
INTRAMUSCULAR | Status: AC
  Filled 2023-12-17: qty 2

## 2023-12-17 MED ORDER — MIDAZOLAM HCL 2 MG/2ML IJ SOLN
INTRAMUSCULAR | Status: AC | PRN
  Administered 2023-12-17 (×2): 1 mg via INTRAVENOUS

## 2023-12-17 MED ORDER — MIDAZOLAM HCL 2 MG/2ML IJ SOLN
INTRAMUSCULAR | Status: AC
  Filled 2023-12-17: qty 2

## 2023-12-17 MED ORDER — HEPARIN SODIUM (PORCINE) 1000 UNIT/ML IJ SOLN
INTRAMUSCULAR | Status: AC
  Filled 2023-12-17: qty 10

## 2023-12-17 MED ORDER — FENTANYL CITRATE (PF) 100 MCG/2ML IJ SOLN
INTRAMUSCULAR | Status: AC | PRN
  Administered 2023-12-17 (×2): 25 ug via INTRAVENOUS
  Administered 2023-12-17: 50 ug via INTRAVENOUS

## 2023-12-17 NOTE — Sedation Documentation (Signed)
 [facility] at bedside to receive patient handoff. Patient is alert and oriented x4, ambulated with assistance to EMS stretcher. Patient transported back to [facility].

## 2023-12-17 NOTE — Progress Notes (Signed)
 Pt returned to room 318 via stretcher by [facility] s/p bone marrow biopsy. Pt drowsy but awake and oriented. Denies any c/o pain at present. DDI to right posterior hip area, old drainage noted (serous). VSS. Son at bedside. Advised to call for needs, pt and son state understanding.

## 2023-12-17 NOTE — Procedures (Signed)
 Interventional Radiology Procedure Note  Procedure: Fluoroscopic guided core biopsy of right iliac bone  Complications: None  Recommendations: - Bedrest supine x 1 hrs - Hydrocodone PRN  Pain - Follow biopsy results   Ester Sides, MD

## 2023-12-17 NOTE — Progress Notes (Signed)
  Progress Note   Patient: Deanna Anderson FMW:[id number] DOB: 1970-10-13 DOA: 12/14/2023     2 DOS: the patient was seen and examined on 12/17/2023   Brief hospital admission narrative: As per H&P written by Dr. Dena on 12/15/2023 Deanna Anderson is a 53 y.o. female with no medical history who presented with right upper quadrant abdominal pain.  Patient was having worsening pain with eating and has noticed easy bruising over the last several weeks.  She developed some shortness of breath and her symptoms felt improved so she presented to the ER.  On arrival to emergency department she was afebrile and hemodynamically stable.  Labs were obtained which showed bicarb 19, creatinine 0.54, AST 42, ALT 62, WBC 3.1, hemoglobin 6.9, platelets 17,000.  INR 1.1.  Reticulocyte count 26.6, LDH 268.  Hematology was contacted in emergency department and recommended CT abdomen pelvis which showed no acute findings.  Patient was admitted for further hematologic evaluation.  Patient denies infectious complaints.   Assessment and plan 1-pancytopenia -Reporting easy bruising prior to admission. - After blood transfusion WBCs 3.5, hemoglobin 9.6 and platelet count 14K - Appreciate assistance and recommendation by hematology/oncology - Status post bone marrow biopsy/aspiration -Following oncology recommendations 1 unit of platelets will be transfuse - Repeat CBC in a.m. and if otherwise stable most likely discharge home tomorrow. - No overt bleeding appreciated - Patient is in no distress.  2-class I obesity - Low-calorie diet and portion control discussed with patient. -Body mass index is 33.32 kg/m.    Subjective:  No fever, no chest pain, no nausea, no vomiting, no shortness of breath.  Reports overall right upper quadrant discomfort has improved.  Physical Exam: Vitals:   12/16/23 2054 12/16/23 2055 12/17/23 0457 12/17/23 1157  BP: 128/82 128/82 (!) 148/82 127/77  Pulse: 77 71 67 71   Resp: 20  18 16   Temp: 98.2 F (36.8 C) 98.2 F (36.8 C) 98.1 F (36.7 C) 98.5 F (36.9 C)  TempSrc: Oral Oral Oral Oral  SpO2: 99% 100% 100% 100%  Weight:      Height:       General exam: Alert, awake, oriented x 3; no fever, no chest pain, no nausea vomiting.  Reporting noticing easy bruising. Respiratory system: Clear to auscultation. Respiratory effort normal.  Good saturation on room air. Cardiovascular system:RRR. No murmurs, rubs, gallops. Gastrointestinal system: Abdomen is obese, nondistended, soft and nontender. No organomegaly or masses felt. Normal bowel sounds heard. Central nervous system: No focal neurological deficits. Extremities: No cyanosis or clubbing. Skin: No rashes or petechiae. Psychiatry: Judgement and insight appear normal. Mood & affect appropriate.   Latest data Reviewed: Comprehensive metabolic panel: Sodium 138, potassium 4.2, chloride 108, bicarb 20, BUN 14, creatinine 0.51, AST 28, ALT 45 and GFR >60 CBC: White blood cell 3.5, hemoglobin 9.6 and platelet count 14K   Family Communication: Husband and son at bedside.  Disposition: Status is: Inpatient Remains inpatient appropriate because: Continue ongoing workup for pancytopenia.   Time spent: 35 minutes  Author: Eric Nunnery, MD 12/17/2023 4:49 PM  For on call review [email or web address].

## 2023-12-17 NOTE — Progress Notes (Signed)
 Pt transported to [facility] IR for procedure by [facility]. Son and husband state they are going to [facility] as well via POV .

## 2023-12-18 LAB — CBC
HCT: 25.4 % — ABNORMAL LOW (ref 36.0–46.0)
Hemoglobin: 8.7 g/dL — ABNORMAL LOW (ref 12.0–15.0)
MCH: 28.4 pg (ref 26.0–34.0)
MCHC: 34.3 g/dL (ref 30.0–36.0)
MCV: 83 fL (ref 80.0–100.0)
Platelets: 22 K/uL — CL (ref 150–400)
RBC: 3.06 MIL/uL — ABNORMAL LOW (ref 3.87–5.11)
RDW: 15.6 % — ABNORMAL HIGH (ref 11.5–15.5)
WBC: 2.9 K/uL — ABNORMAL LOW (ref 4.0–10.5)
nRBC: 4.1 % — ABNORMAL HIGH (ref 0.0–0.2)

## 2023-12-18 LAB — SURGICAL PATHOLOGY

## 2023-12-18 MED ORDER — OXYCODONE HCL 5 MG PO TABS: 5.0000 mg | ORAL_TABLET | Freq: Four times a day (QID) | ORAL | 0 refills | Status: AC | PRN

## 2023-12-18 MED ORDER — CYANOCOBALAMIN 1000 MCG PO TABS
1000.0000 ug | ORAL_TABLET | Freq: Every day | ORAL | 2 refills | Status: AC
Start: 2023-12-19 — End: ?

## 2023-12-18 MED ORDER — VITAMIN B-12 1000 MCG PO TABS
1000.0000 ug | ORAL_TABLET | Freq: Every day | ORAL | Status: DC
  Administered 2023-12-18: 1000 ug via ORAL
  Filled 2023-12-18: qty 1

## 2023-12-18 MED ORDER — FOLIC ACID 1 MG PO TABS
1.0000 mg | ORAL_TABLET | Freq: Every day | ORAL | Status: DC
  Administered 2023-12-18: 1 mg via ORAL
  Filled 2023-12-18: qty 1

## 2023-12-18 MED ORDER — FOLIC ACID 1 MG PO TABS: 1.0000 mg | ORAL_TABLET | Freq: Every day | ORAL | 2 refills | Status: AC

## 2023-12-18 MED ORDER — METHOCARBAMOL 750 MG PO TABS
750.0000 mg | ORAL_TABLET | Freq: Three times a day (TID) | ORAL | 0 refills | Status: AC | PRN
Start: 2023-12-18 — End: ?

## 2023-12-18 NOTE — Plan of Care (Signed)
   Problem: Education: Goal: Knowledge of General Education information will improve Description Including pain rating scale, medication(s)/side effects and non-pharmacologic comfort measures Outcome: Progressing

## 2023-12-18 NOTE — Discharge Summary (Signed)
 Physician Discharge Summary   Patient: Deanna Anderson MRN: [id number] DOB: Aug 03, 1970  Admit date:     12/14/2023  Discharge date: 12/18/23  Discharge Physician: Eric Nunnery   PCP: Department, [facility]   Recommendations at discharge:  Repeat CBC with differential to follow WBCs, hemoglobin and platelets count trend Repeat basic metabolic panel to follow electrolytes and renal function Make sure patient follow-up with hematology/oncology service as instructed. Assist patient with weight loss management.  Discharge Diagnoses: Principal Problem:   Pancytopenia (HCC) Active Problems:   Right upper quadrant abdominal pain Class I obesity  Brief hospital admission narrative: As per H&P written by Dr. Dena on 12/15/2023 Deanna Anderson is a 53 y.o. female with no medical history who presented with right upper quadrant abdominal pain.  Patient was having worsening pain with eating and has noticed easy bruising over the last several weeks.  She developed some shortness of breath and her symptoms felt improved so she presented to the ER.  On arrival to emergency department she was afebrile and hemodynamically stable.  Labs were obtained which showed bicarb 19, creatinine 0.54, AST 42, ALT 62, WBC 3.1, hemoglobin 6.9, platelets 17,000.  INR 1.1.  Reticulocyte count 26.6, LDH 268.  Hematology was contacted in emergency department and recommended CT abdomen pelvis which showed no acute findings.  Patient was admitted for further hematologic evaluation.  Patient denies infectious complaints.  Assessment and Plan: 1-pancytopenia -Reporting easy bruising prior to admission. - Patient received 1 unit of PRBCs throughout hospitalization. - Status post bone marrow biopsy/aspiration -Following hematology/oncology recommendations 1 unit of platelets was transfused on 12/17/2023. - CBC at discharge demonstrating hemoglobin of 8.7, WBCs 2.9 and platelets count 22K. - No overt  bleeding appreciated - Patient is in no distress and hemodynamically stable to go home.. -Given low normal levels of B12 and folic acid daily supplementation provided at discharge.   2-class I obesity - Low-calorie diet and portion control discussed with patient. -Body mass index is 33.32 kg/m.   3-right upper quadrant discomfort - CT images demonstrating no acute intra-abdominal process or structural masses/changes in her liver. - Positive mild steatosis appreciated - Continue as needed analgesics, diet/lifestyle changes and adequate hydration.  Consultants: Hematology/oncology. Procedures performed: See below for x-ray reports; bone marrow biopsy. Disposition: Home Diet recommendation: Low calorie diet.  DISCHARGE MEDICATION: Allergies as of 12/18/2023   No Known Allergies      Medication List     STOP taking these medications    ibuprofen 200 MG tablet Commonly known as: ADVIL   lidocaine  4 % cream Commonly known as: LMX       TAKE these medications    calcium carbonate 1250 (500 Ca) MG chewable tablet Commonly known as: OS-CAL Chew 1 tablet by mouth daily.   COLLAGEN PO Take 1 capsule by mouth daily.   cyanocobalamin 1000 MCG tablet Take 1 tablet (1,000 mcg total) by mouth daily. Start taking on: December 19, 2023   folic acid 1 MG tablet Commonly known as: FOLVITE Take 1 tablet (1 mg total) by mouth daily. Start taking on: December 19, 2023   methocarbamol  750 MG tablet Commonly known as: ROBAXIN  Take 1 tablet (750 mg total) by mouth every 8 (eight) hours as needed for muscle spasms.   multivitamin capsule Take 1 capsule by mouth daily.   oxyCODONE  5 MG immediate release tablet Commonly known as: Oxy IR/ROXICODONE  Take 1 tablet (5 mg total) by mouth every 6 (six) hours as needed for severe pain (  pain score 7-10).   vitamin E 1000 UNIT capsule Take 1,000 Units by mouth daily.        Follow-up Information     Rogers Hai, MD Follow up.    Specialty: Hematology Why: contact office by Monday 12/21/23 if not appointments details has been provided yet. Contact information: [address] [place] [address] [phone]         Department, [facility]. Schedule an appointment as soon as possible for a visit in 10 day(s).   Contact information: [address] [place] [address] [phone]                Discharge Exam: Deanna Anderson   12/14/23 1855  Weight: 80 kg   General exam: Alert, awake, oriented x 3 Respiratory system: Clear to auscultation. Respiratory effort normal. Cardiovascular system:RRR. No murmurs, rubs, gallops. Gastrointestinal system: Abdomen is nondistended, soft and nontender. No organomegaly or masses felt. Normal bowel sounds heard. Central nervous system: Alert and oriented. No focal neurological deficits. Extremities: No C/C/E, +pedal pulses Skin: No rashes, lesions or ulcers Psychiatry: Judgement and insight appear normal. Mood & affect appropriate.    Condition at discharge: Stable and improved.  The results of significant diagnostics from this hospitalization (including imaging, microbiology, ancillary and laboratory) are listed below for reference.   Imaging Studies: IR BONE MARROW BIOPSY Result Date: 12/17/2023 INDICATION: 53 year old female with history of pancytopenia. EXAM: FLUOROSCOPIC-GUIDED BONE MARROW BIOPSY AND ASPIRATION MEDICATIONS: None ANESTHESIA/SEDATION: Fentanyl  100 mcg IV; Versed  2 mg IV Sedation Time: 11 minutes; The patient was continuously monitored during the procedure by the interventional radiology nurse under my direct supervision. COMPLICATIONS: None immediate. PROCEDURE: Informed consent was obtained from the patient following an explanation of the procedure, risks, benefits and alternatives. The patient understands, agrees and consents for the procedure. All questions were addressed. A time out was performed prior to the initiation of the  procedure. The patient was positioned prone and the right iliac marrow space was identified fluoroscopically. The operative site was prepped and draped in the usual sterile fashion. Under sterile conditions and local anesthesia, a 22 gauge spinal needle was utilized for procedural planning. Next, an 11 gauge coaxial bone biopsy needle was advanced into the right iliac marrow space. Initially, a bone marrow aspiration was performed however no aspirate was yielded. Next, a bone marrow biopsy was obtained with the 11 gauge outer bone marrow device. The 11 gauge coaxial bone biopsy needle was re-advanced into a slightly different location within the left iliac marrow space and an additional bone marrow biopsy was obtained. The needle was removed and superficial hemostasis was obtained with manual compression. A dressing was applied. The patient tolerated the procedure well without immediate post procedural complication. IMPRESSION: Successful fluoroscopic guided right iliac bone marrow core biopsy. Despite multiple attempts, no aspirate was able to be obtained. Ester Sides, MD [facility] Electronically Signed   By: Ester Sides M.D.   On: 12/17/2023 22:45   CT ABDOMEN PELVIS W CONTRAST Result Date: 12/14/2023 CLINICAL DATA:  Acute nonlocalized abdominal EXAM: CT ABDOMEN AND PELVIS WITH CONTRAST TECHNIQUE: Multidetector CT imaging of the abdomen and pelvis was performed using the standard protocol following bolus administration of intravenous contrast. RADIATION DOSE REDUCTION: This exam was performed according to the departmental dose-optimization program which includes automated exposure control, adjustment of the mA and/or kV according to patient size and/or use of iterative reconstruction technique. CONTRAST:  100mL OMNIPAQUE  IOHEXOL  300 MG/ML  SOLN COMPARISON:  None Available. FINDINGS: Lower chest: No acute abnormality. Hepatobiliary: Mild hepatic  steatosis. No enhancing intrahepatic mass. No intra or extrahepatic biliary ductal dilation. Gallbladder unremarkable. Pancreas: Unremarkable Spleen: Unremarkable Adrenals/Urinary Tract: Adrenal glands are unremarkable. Kidneys are normal, without renal calculi, focal lesion, or hydronephrosis. Bladder is unremarkable. Stomach/Bowel: Stomach is within normal limits. Appendix appears normal. No evidence of bowel wall thickening, distention, or inflammatory changes. Vascular/Lymphatic: No significant vascular findings are present. No enlarged abdominal or pelvic lymph nodes. Reproductive: Uterus and bilateral adnexa are unremarkable. Other: No abdominal wall hernia or abnormality. No abdominopelvic ascites. Musculoskeletal: Remote T11 superior endplate fracture with mild loss of height and no retropulsion. No acute bone abnormality. No lytic or blastic bone lesion. IMPRESSION: 1. No acute intra-abdominal pathology identified. 2. Mild hepatic steatosis. 3. Remote T11 superior endplate fracture. Electronically Signed   By: Dorethia Molt M.D.   On: 12/14/2023 21:52    Microbiology: Results for orders placed or performed in visit on 06/09/23  Fecal occult blood, imunochemical     Status: None   Collection Time: 06/11/23 12:00 AM   Specimen: STOOL   Stool Patient will wri  Result Value Ref Range Status   Fecal Occult Bld Negative Negative Final    Labs: CBC: Recent Labs  Lab 12/14/23 1946 12/14/23 2235 12/15/23 0611 12/16/23 0437 12/17/23 0420 12/18/23 0435  WBC 3.1* 2.8*  --  3.7* 3.5* 2.9*  NEUTROABS  --  1.0*  --  1.0* 0.9*  --   HGB 6.9* 6.3* 9.2* 9.8* 9.6* 8.7*  HCT 21.0* 18.6* 26.6* 28.8* 28.8* 25.4*  MCV 86.4 85.3  --  85.0 82.5 83.0  PLT 17* 15*  --  14* 14* 22*   Basic Metabolic Panel: Recent Labs  Lab 12/14/23 1946 12/16/23 0437  NA 137 138  K 3.9 4.2  CL 108 108  CO2 19* 20*  GLUCOSE 114* 114*  BUN 15 14  CREATININE 0.54 0.51  CALCIUM 8.8* 8.8*   Liver Function  Tests: Recent Labs  Lab 12/14/23 1946 12/16/23 0437  AST 42* 28  ALT 62* 45*  ALKPHOS 142* 114  BILITOT 1.0 0.4  PROT 7.7 7.1  ALBUMIN 4.0 3.5   CBG: No results for input(s): GLUCAP in the last 168 hours.  Discharge time spent: 28 minutes.  Signed: Eric Nunnery, MD [facility] 12/18/2023

## 2023-12-18 NOTE — Plan of Care (Signed)
   Problem: Education: Goal: Knowledge of General Education information will improve Description Including pain rating scale, medication(s)/side effects and non-pharmacologic comfort measures Outcome: Progressing   Problem: Health Behavior/Discharge Planning: Goal: Ability to manage health-related needs will improve Outcome: Progressing

## 2023-12-21 ENCOUNTER — Encounter: Payer: Self-pay | Admitting: Hematology

## 2023-12-21 DIAGNOSIS — C9100 Acute lymphoblastic leukemia not having achieved remission: Secondary | ICD-10-CM | POA: Insufficient documentation

## 2023-12-22 ENCOUNTER — Inpatient Hospital Stay: Payer: Self-pay | Attending: Hematology | Admitting: Hematology

## 2023-12-22 ENCOUNTER — Inpatient Hospital Stay (HOSPITAL_BASED_OUTPATIENT_CLINIC_OR_DEPARTMENT_OTHER): Admitting: Hematology

## 2023-12-22 ENCOUNTER — Encounter (HOSPITAL_COMMUNITY): Payer: Self-pay | Admitting: Hematology

## 2023-12-22 ENCOUNTER — Other Ambulatory Visit: Payer: Self-pay

## 2023-12-22 VITALS — BP 137/88 | HR 88 | Temp 97.9°F | Resp 18 | Ht 61.0 in | Wt 165.3 lb

## 2023-12-22 DIAGNOSIS — D61818 Other pancytopenia: Secondary | ICD-10-CM | POA: Insufficient documentation

## 2023-12-22 DIAGNOSIS — R7402 Elevation of levels of lactic acid dehydrogenase (LDH): Secondary | ICD-10-CM | POA: Insufficient documentation

## 2023-12-22 DIAGNOSIS — C9100 Acute lymphoblastic leukemia not having achieved remission: Secondary | ICD-10-CM | POA: Insufficient documentation

## 2023-12-22 DIAGNOSIS — D696 Thrombocytopenia, unspecified: Secondary | ICD-10-CM | POA: Insufficient documentation

## 2023-12-22 LAB — CBC WITH DIFFERENTIAL/PLATELET
Abs Immature Granulocytes: 0.2 K/uL — ABNORMAL HIGH (ref 0.00–0.07)
Band Neutrophils: 4 %
Basophils Absolute: 0 K/uL (ref 0.0–0.1)
Basophils Relative: 0 %
Blasts: 4 %
Eosinophils Absolute: 0.1 K/uL (ref 0.0–0.5)
Eosinophils Relative: 4 %
HCT: 24.6 % — ABNORMAL LOW (ref 36.0–46.0)
Hemoglobin: 8.5 g/dL — ABNORMAL LOW (ref 12.0–15.0)
Lymphocytes Relative: 56 %
Lymphs Abs: 1.8 K/uL (ref 0.7–4.0)
MCH: 28.5 pg (ref 26.0–34.0)
MCHC: 34.6 g/dL (ref 30.0–36.0)
MCV: 82.6 fL (ref 80.0–100.0)
Metamyelocytes Relative: 3 %
Monocytes Absolute: 0 K/uL — ABNORMAL LOW (ref 0.1–1.0)
Monocytes Relative: 0 %
Myelocytes: 3 %
Neutro Abs: 1 K/uL — ABNORMAL LOW (ref 1.7–7.7)
Neutrophils Relative %: 26 %
Platelets: 16 K/uL — CL (ref 150–400)
RBC: 2.98 MIL/uL — ABNORMAL LOW (ref 3.87–5.11)
RDW: 15.4 % (ref 11.5–15.5)
WBC: 3.3 K/uL — ABNORMAL LOW (ref 4.0–10.5)
nRBC: 4.6 % — ABNORMAL HIGH (ref 0.0–0.2)

## 2023-12-22 LAB — SAMPLE TO BLOOD BANK

## 2023-12-22 LAB — FLOW CYTOMETRY

## 2023-12-22 NOTE — Progress Notes (Signed)
 [facility] [address], [place] [address]    Clinic Day:  12/22/2023  Referring physician: Department, Lloyd Haws*  Patient Care Team: Department, [facility] as PCP - General   ASSESSMENT & PLAN:   Assessment: 1.  Pancytopenia: - Presentation with right lower anterior rib pain, on deep inspiration and certain movements, started on Sunday.  Also had easy bruising on the lower extremities for 2 weeks. - CBC in the ER on 12/14/2023: White count 3.1, PLT-17, Hb-6.9.  MCV 86 - Repeat CBCD showed PLT-15, WBC-2.8, Hb-6.3.  LDH was elevated at 268.  Uric acid was normal.  B12 was normal.  Folic acid  was mildly low at 5.9. - Peripheral blood smear reviewed by me showed thrombocytopenia and leukopenia with left side shift.  Occasional nucleated RBC. - Direct Coombs test is negative. - CTAP (12/13/2020): No acute intra-abdominal pathology identified.  Remote T11 superior endplate fracture. - She received 2 units PRBC with improvement of hemoglobin to 9.2. - She did receive 1 unit platelets on 12/17/2023 for platelet count of 14. 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  2. Social/Family History: -No smoking history. No history of drinking alcohol. No chemical exposures.  -Paternal uncle had head and neck cancer. Paternal cousin had leukemia.   Plan:  1.  B-cell ALL: - We discussed results of the bone marrow biopsy from 12/17/2023, consistent with B-cell AL L. - FISH panel for ALL could not be completed due to insufficient quantity. - Labs today: Platelet count 16.  Hemoglobin 8.5.  White count is 3.3.  No indication for transfusion. - I have called and talked to Dr. Perri at [facility].  He has kindly agreed to see this patient on 12/23/2023 and discuss further options. - I did not give a follow-up appointment.  We will be glad to check her blood counts and transfuse as needed closer to home.   No orders of the defined types were placed in this  encounter.     LILLETTE Verneta SAUNDERS Teague,acting as a Neurosurgeon for Alean Stands, MD.,have documented all relevant documentation on the behalf of Alean Stands, MD,as directed by  Alean Stands, MD while in the presence of Alean Stands, MD.   I, Alean Stands MD, have reviewed the above documentation for accuracy and completeness, and I agree with the above.   Verneta SAUNDERS Ege   8/5/20258:17 AM  CHIEF COMPLAINT:   Diagnosis: B-cell ALL  Prior Therapy: None  Current Therapy:  Under workup   HISTORY OF PRESENT ILLNESS:   Ms. Deanna Anderson is a 53 year old Hispanic female seen in consultation today at the request of Dr. Melvenia for pancytopenia. She developed a right upper quadrant pain, worse on deep inspiration and certain movements on Sunday. It got worse on Monday when she presented to the ER. She also noticed easy bruising on her lower extremities for the last 2 weeks. No recent infections including tick bites. No recent use of antibiotics. Denies any fevers, night sweats or weight loss in the last 6 months. Her son and husband at bedside. She had reportedly blood work done approximately 2 months ago by her primary doctor and was told everything was normal. She is not on any regular prescription medications. She takes multivitamin and ibuprofen as needed for neck pain. Denies any history of cigarette smoking or chemical exposure. Family history significant for paternal uncle with head and neck cancer. Paternal cousin had leukemia.   INTERVAL HISTORY:   Deanna Anderson is a 53 y.o.  female presenting to clinic today for follow up of Pancytopenia. She was last seen by me on 12/15/2023 as an inpatient and was discharged on 12/18/2023.  Since last being seen by me, she underwent bone marrow biopsy on 12/17/2023. 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 6% of the cells are dim CD45, partial CD10, partial CD19, bright CD20, CD38, CD200, dim  CD34/partial and CD22 positive.  The light chains are interpreted as not expressed.   Today, she states that she is doing well overall. Her appetite level is at 25%. Her energy level is at 0%. Deanna Anderson is accompanied by her son and a Nurse, learning disability.   She had one episode of nasal discharge with blood last week that resolved on its own. She denies any other bleeding issues. Deanna Anderson is taking vitamin B 12 supplements and other prescribed medications. She is does not require any OTC pain medications.   Deanna Anderson notes mild hot flashes. She denies any chills or night sweats.   Deanna Anderson had one episode of abdominal pain on 12/20/2023 that has resolved. She states neck pain and abdominal pain have both improved since then. She also had one episode of nausea yesterday after eating.   Deanna Anderson has an appointment with Dr Perri at [facility] tomorrow morning.   PAST MEDICAL HISTORY:   Past Medical History: No past medical history on file.  Surgical History: Past Surgical History:  Procedure Laterality Date   IR BONE MARROW BIOPSY  12/17/2023    Social History: Social History   Socioeconomic History   Marital status: Married    Spouse name: Not on file   Number of children: 3   Years of education: Not on file   Highest education level: 6th grade  Occupational History   Not on file  Tobacco Use   Smoking status: Never   Smokeless tobacco: Never  Vaping Use   Vaping status: Never Used  Substance and Sexual Activity   Alcohol use: No   Drug use: No   Sexual activity: Yes    Partners: Male    Birth control/protection: Injection  Other Topics Concern   Not on file  Social History Narrative   Pt was born in [place]   Social Drivers of Health   Financial Resource Strain: Not on file  Food Insecurity: No Food Insecurity (12/15/2023)   Hunger Vital Sign    Worried About Running Out of Food in the Last Year: Never true    Ran Out of Food in the Last Year: Never true  Transportation Needs: No  Transportation Needs (12/15/2023)   PRAPARE - Administrator, Civil Service (Medical): No    Lack of Transportation (Non-Medical): No  Physical Activity: Not on file  Stress: Not on file  Social Connections: Not on file  Intimate Partner Violence: Not At Risk (12/15/2023)   Humiliation, Afraid, Rape, and Kick questionnaire    Fear of Current or Ex-Partner: No    Emotionally Abused: No    Physically Abused: No    Sexually Abused: No    Family History: Family History  Problem Relation Age of Onset   Breast cancer Neg Hx     Current Medications:  Current Outpatient Medications:    calcium carbonate (OS-CAL) 1250 (500 Ca) MG chewable tablet, Chew 1 tablet by mouth daily., Disp: , Rfl:    Collagen-Vitamin C-Biotin (COLLAGEN PO), Take 1 capsule by mouth daily., Disp: , Rfl:    cyanocobalamin  1000 MCG tablet, Take  1 tablet (1,000 mcg total) by mouth daily., Disp: 30 tablet, Rfl: 2   folic acid  (FOLVITE ) 1 MG tablet, Take 1 tablet (1 mg total) by mouth daily., Disp: 30 tablet, Rfl: 2   methocarbamol  (ROBAXIN ) 750 MG tablet, Take 1 tablet (750 mg total) by mouth every 8 (eight) hours as needed for muscle spasms., Disp: 30 tablet, Rfl: 0   Multiple Vitamin (MULTIVITAMIN) capsule, Take 1 capsule by mouth daily., Disp: , Rfl:    oxyCODONE  (OXY IR/ROXICODONE ) 5 MG immediate release tablet, Take 1 tablet (5 mg total) by mouth every 6 (six) hours as needed for severe pain (pain score 7-10)., Disp: 30 tablet, Rfl: 0   vitamin E 1000 UNIT capsule, Take 1,000 Units by mouth daily., Disp: , Rfl:    Allergies: No Known Allergies  REVIEW OF SYSTEMS:   Review of Systems  Constitutional:  Positive for fatigue. Negative for chills and fever.  HENT:   Negative for lump/mass, mouth sores, nosebleeds, sore throat and trouble swallowing.   Eyes:  Negative for eye problems.  Respiratory:  Positive for shortness of breath. Negative for cough.   Cardiovascular:  Positive for leg swelling (in  feet). Negative for chest pain and palpitations.  Gastrointestinal:  Positive for abdominal pain (2/10 severity). Negative for constipation, diarrhea, nausea and vomiting.  Endocrine: Positive for hot flashes (mild).  Genitourinary:  Negative for bladder incontinence, difficulty urinating, dysuria, frequency, hematuria and nocturia.   Musculoskeletal:  Positive for neck pain (2/10 severity). Negative for arthralgias, back pain, flank pain and myalgias.  Skin:  Negative for itching and rash.  Neurological:  Negative for dizziness, headaches and numbness.  Hematological:  Does not bruise/bleed easily.  Psychiatric/Behavioral:  Negative for depression, sleep disturbance and suicidal ideas. The patient is not nervous/anxious.   All other systems reviewed and are negative.    VITALS:   There were no vitals taken for this visit.  Wt Readings from Last 3 Encounters:  12/14/23 176 lb 5.9 oz (80 kg)  06/09/23 178 lb (80.7 kg)  06/28/19 178 lb (80.7 kg)    There is no height or weight on file to calculate BMI.  Performance status (ECOG): 1 - Symptomatic but completely ambulatory  PHYSICAL EXAM:   Physical Exam Vitals and nursing note reviewed. Exam conducted with a chaperone present.  Constitutional:      Appearance: Normal appearance.  Cardiovascular:     Rate and Rhythm: Normal rate and regular rhythm.     Pulses: Normal pulses.     Heart sounds: Normal heart sounds.  Pulmonary:     Effort: Pulmonary effort is normal.     Breath sounds: Normal breath sounds.  Abdominal:     Palpations: Abdomen is soft. There is no hepatomegaly, splenomegaly or mass.     Tenderness: There is no abdominal tenderness.  Musculoskeletal:     Right lower leg: No edema.     Left lower leg: No edema.  Lymphadenopathy:     Cervical: No cervical adenopathy.     Right cervical: No superficial, deep or posterior cervical adenopathy.    Left cervical: No superficial, deep or posterior cervical adenopathy.      Upper Body:     Right upper body: No supraclavicular or axillary adenopathy.     Left upper body: No supraclavicular or axillary adenopathy.  Neurological:     General: No focal deficit present.     Mental Status: She is alert and oriented to person, place, and time.  Psychiatric:  Mood and Affect: Mood normal.        Behavior: Behavior normal.     LABS:   CBC     Component Value Date/Time   WBC 2.9 (L) 12/18/2023 0435   RBC 3.06 (L) 12/18/2023 0435   HGB 8.7 (L) 12/18/2023 0435   HCT 25.4 (L) 12/18/2023 0435   PLT 22 (LL) 12/18/2023 0435   MCV 83.0 12/18/2023 0435   MCH 28.4 12/18/2023 0435   MCHC 34.3 12/18/2023 0435   RDW 15.6 (H) 12/18/2023 0435   LYMPHSABS 2.2 12/17/2023 0420   MONOABS 0.0 (L) 12/17/2023 0420   EOSABS 0.2 12/17/2023 0420   BASOSABS 0.0 12/17/2023 0420    CMP      Component Value Date/Time   NA 138 12/16/2023 0437   K 4.2 12/16/2023 0437   CL 108 12/16/2023 0437   CO2 20 (L) 12/16/2023 0437   GLUCOSE 114 (H) 12/16/2023 0437   BUN 14 12/16/2023 0437   CREATININE 0.51 12/16/2023 0437   CALCIUM 8.8 (L) 12/16/2023 0437   PROT 7.1 12/16/2023 0437   ALBUMIN 3.5 12/16/2023 0437   AST 28 12/16/2023 0437   ALT 45 (H) 12/16/2023 0437   ALKPHOS 114 12/16/2023 0437   BILITOT 0.4 12/16/2023 0437   GFRNONAA >60 12/16/2023 0437     No results found for: CEA1, CEA / No results found for: CEA1, CEA No results found for: PSA1 No results found for: CAN199 No results found for: CAN125  No results found for: TOTALPROTELP, ALBUMINELP, A1GS, A2GS, BETS, BETA2SER, GAMS, MSPIKE, SPEI No results found for: TIBC, FERRITIN, IRONPCTSAT Lab Results  Component Value Date   LDH 268 (H) 12/14/2023     STUDIES:   IR BONE MARROW BIOPSY Result Date: 12/17/2023 INDICATION: 53 year old female with history of pancytopenia. EXAM: FLUOROSCOPIC-GUIDED BONE MARROW BIOPSY AND ASPIRATION MEDICATIONS: None ANESTHESIA/SEDATION:  Fentanyl  100 mcg IV; Versed  2 mg IV Sedation Time: 11 minutes; The patient was continuously monitored during the procedure by the interventional radiology nurse under my direct supervision. COMPLICATIONS: None immediate. PROCEDURE: Informed consent was obtained from the patient following an explanation of the procedure, risks, benefits and alternatives. The patient understands, agrees and consents for the procedure. All questions were addressed. A time out was performed prior to the initiation of the procedure. The patient was positioned prone and the right iliac marrow space was identified fluoroscopically. The operative site was prepped and draped in the usual sterile fashion. Under sterile conditions and local anesthesia, a 22 gauge spinal needle was utilized for procedural planning. Next, an 11 gauge coaxial bone biopsy needle was advanced into the right iliac marrow space. Initially, a bone marrow aspiration was performed however no aspirate was yielded. Next, a bone marrow biopsy was obtained with the 11 gauge outer bone marrow device. The 11 gauge coaxial bone biopsy needle was re-advanced into a slightly different location within the left iliac marrow space and an additional bone marrow biopsy was obtained. The needle was removed and superficial hemostasis was obtained with manual compression. A dressing was applied. The patient tolerated the procedure well without immediate post procedural complication. IMPRESSION: Successful fluoroscopic guided right iliac bone marrow core biopsy. Despite multiple attempts, no aspirate was able to be obtained. Ester Sides, MD [facility] Radiology Electronically Signed   By: Ester Sides M.D.   On: 12/17/2023 22:45   CT ABDOMEN PELVIS W CONTRAST Result Date: 12/14/2023 CLINICAL DATA:  Acute nonlocalized abdominal EXAM: CT ABDOMEN AND PELVIS WITH CONTRAST  TECHNIQUE: Multidetector CT imaging of the abdomen and pelvis was  performed using the standard protocol following bolus administration of intravenous contrast. RADIATION DOSE REDUCTION: This exam was performed according to the departmental dose-optimization program which includes automated exposure control, adjustment of the mA and/or kV according to patient size and/or use of iterative reconstruction technique. CONTRAST:  OMNIPAQUE  IOHEXOL  300 MG/ML  SOLN COMPARISON:  None Available. FINDINGS: Lower chest: No acute abnormality. Hepatobiliary: Mild hepatic steatosis. No enhancing intrahepatic mass. No intra or extrahepatic biliary ductal dilation. Gallbladder unremarkable. Pancreas: Unremarkable Spleen: Unremarkable Adrenals/Urinary Tract: Adrenal glands are unremarkable. Kidneys are normal, without renal calculi, focal lesion, or hydronephrosis. Bladder is unremarkable. Stomach/Bowel: Stomach is within normal limits. Appendix appears normal. No evidence of bowel wall thickening, distention, or inflammatory changes. Vascular/Lymphatic: No significant vascular findings are present. No enlarged abdominal or pelvic lymph nodes. Reproductive: Uterus and bilateral adnexa are unremarkable. Other: No abdominal wall hernia or abnormality. No abdominopelvic ascites. Musculoskeletal: Remote T11 superior endplate fracture with mild loss of height and no retropulsion. No acute bone abnormality. No lytic or blastic bone lesion. IMPRESSION: 1. No acute intra-abdominal pathology identified. 2. Mild hepatic steatosis. 3. Remote T11 superior endplate fracture. Electronically Signed   By: Dorethia Molt M.D.   On: 12/14/2023 21:52

## 2023-12-22 NOTE — Patient Instructions (Addendum)
 [facility]  Discharge Instructions  You were seen and examined today by Dr. Rogers.  Dr. Rogers discussed your most recent lab work and Bone marrow biopsy which revealed that it is an acute lymphocytic leukemia ALL.     Keep appointment for consult tomorrow with Dr. Perri at [facility]. Dr. Perri will explain what the next step is for treatment.  Follow-up as scheduled.    Thank you for choosing [facility] to provide your oncology and hematology care.   To afford each patient quality time with our provider, please arrive at least 15 minutes before your scheduled appointment time. You may need to reschedule your appointment if you arrive late (10 or more minutes). Arriving late affects you and other patients whose appointments are after yours.  Also, if you miss three or more appointments without notifying the office, you may be dismissed from the clinic at the provider's discretion.    Again, thank you for choosing [facility]  Our hope is that these requests will decrease the amount of time that you wait before being seen by our physicians.   If you have a lab appointment with the Cancer Center - please note that after April 8th, all labs will be drawn in the cancer center.  You do not have to check in or register with the main entrance as you have in the past but will complete your check-in at the cancer center.            _____________________________________________________________  Should you have questions after your visit to [facility], please contact our office at [phone] and follow the prompts.  Our office hours are 8:00 a.m. to 4:30 p.m. Monday - Thursday and 8:00 a.m. to 2:30 p.m. Friday.  Please note that voicemails left after 4:00 p.m. may not be returned until the following business day.  We are closed weekends and all major holidays.  You do have access to a nurse 24-7, just  call the main number to the clinic [phone] and do not press any options, hold on the line and a nurse will answer the phone.    For prescription refill requests, have your pharmacy contact our office and allow 72 hours.    Masks are no longer required in the cancer centers. If you would like for your care team to wear a mask while they are taking care of you, please let them know. You may have one support person who is at least 51 years old accompany you for your appointments.

## 2023-12-22 NOTE — Progress Notes (Signed)
 CRITICAL VALUE ALERT Critical value received:  platelets 16 Date of notification:  12-22-23 Time of notification: 1114 Critical value read back:  Yes.   Nurse who received alert:  C. Alam Guterrez RN MD notified time and response:  1115 Dr. Rogers, no new orders.

## 2023-12-23 ENCOUNTER — Other Ambulatory Visit: Payer: Self-pay

## 2023-12-23 DIAGNOSIS — C9100 Acute lymphoblastic leukemia not having achieved remission: Secondary | ICD-10-CM

## 2023-12-23 DIAGNOSIS — D61818 Other pancytopenia: Secondary | ICD-10-CM

## 2023-12-25 ENCOUNTER — Inpatient Hospital Stay: Payer: Self-pay

## 2023-12-25 ENCOUNTER — Encounter (HOSPITAL_COMMUNITY): Payer: Self-pay | Admitting: Hematology

## 2023-12-25 VITALS — BP 114/76 | HR 77 | Temp 98.0°F | Resp 18

## 2023-12-25 DIAGNOSIS — D61818 Other pancytopenia: Secondary | ICD-10-CM

## 2023-12-25 DIAGNOSIS — C9100 Acute lymphoblastic leukemia not having achieved remission: Secondary | ICD-10-CM

## 2023-12-25 LAB — BPAM PLATELET PHERESIS
Blood Product Expiration Date: 202508032359
Blood Product Expiration Date: 202508032359
ISSUE DATE / TIME: 202507311723
ISSUE DATE / TIME: 202507311723
Unit Type and Rh: 202508102359
Unit Type and Rh: 6200
Unit Type and Rh: 6200

## 2023-12-25 LAB — TYPE AND SCREEN
ABO/RH(D): A POS
Antibody Screen: NEGATIVE

## 2023-12-25 LAB — PREPARE PLATELET PHERESIS
Unit division: 0
Unit division: 0

## 2023-12-25 LAB — CBC WITH DIFFERENTIAL/PLATELET
Abs Immature Granulocytes: 0.1 K/uL — ABNORMAL HIGH (ref 0.00–0.07)
Basophils Absolute: 0 K/uL (ref 0.0–0.1)
Basophils Relative: 0 %
Eosinophils Absolute: 0 K/uL (ref 0.0–0.5)
Eosinophils Relative: 1 %
HCT: 25 % — ABNORMAL LOW (ref 36.0–46.0)
Hemoglobin: 8.7 g/dL — ABNORMAL LOW (ref 12.0–15.0)
Lymphocytes Relative: 74 %
Lymphs Abs: 2.1 K/uL (ref 0.7–4.0)
MCH: 28.5 pg (ref 26.0–34.0)
MCHC: 34.8 g/dL (ref 30.0–36.0)
MCV: 82 fL (ref 80.0–100.0)
Metamyelocytes Relative: 2 %
Monocytes Absolute: 0.1 K/uL (ref 0.1–1.0)
Monocytes Relative: 2 %
Neutro Abs: 0.6 K/uL — ABNORMAL LOW (ref 1.7–7.7)
Neutrophils Relative %: 21 %
Platelets: 17 K/uL — CL (ref 150–400)
RBC: 3.05 MIL/uL — ABNORMAL LOW (ref 3.87–5.11)
RDW: 15.2 % (ref 11.5–15.5)
WBC: 2.9 K/uL — ABNORMAL LOW (ref 4.0–10.5)
nRBC: 4.9 % — ABNORMAL HIGH (ref 0.0–0.2)

## 2023-12-25 LAB — SAMPLE TO BLOOD BANK

## 2023-12-25 MED ORDER — SODIUM CHLORIDE 0.9% IV SOLUTION
250.0000 mL | INTRAVENOUS | Status: DC
  Administered 2023-12-25: 100 mL via INTRAVENOUS

## 2023-12-25 MED ORDER — DIPHENHYDRAMINE HCL 25 MG PO CAPS
25.0000 mg | ORAL_CAPSULE | Freq: Once | ORAL | Status: AC
  Administered 2023-12-25: 25 mg via ORAL
  Filled 2023-12-25: qty 1

## 2023-12-25 MED ORDER — ACETAMINOPHEN 325 MG PO TABS
650.0000 mg | ORAL_TABLET | Freq: Once | ORAL | Status: AC
  Administered 2023-12-25: 650 mg via ORAL
  Filled 2023-12-25: qty 2

## 2023-12-25 NOTE — Progress Notes (Addendum)
 CRITICAL VALUE ALERT Critical value received:  Platelets= 17 Date of notification:  12/25/2023 Time of notification: 0907 Critical value read back:  Yes.   Nurse who received alert:  Dena Daring, RN MD notified time and response:  Mickiel Dry, MD notified at [phone].  We will give 1 unit of platelets per [facility] orders.

## 2023-12-25 NOTE — Progress Notes (Signed)
 PLT 17. Will proceed with PLT transfusion per orders. Pt tolerated PLT transfusion well with no signs of complications. PLT given per protocol. Vitals stable pre, during, and post transfusion. RN educated pt on the importance of notifying the clinic if any complications occur and when to seek emergency care. Pt and pt.'s son  verbalized understanding and all questions answered at this time. All follow ups as scheduled.   Lister Brizzi

## 2023-12-26 LAB — BPAM PLATELET PHERESIS
Blood Product Expiration Date: 202508082359
ISSUE DATE / TIME: 202508081032
Unit Type and Rh: 6200

## 2023-12-26 LAB — PREPARE PLATELET PHERESIS: Unit division: 0

## 2023-12-28 LAB — PATHOLOGIST SMEAR REVIEW

## 2024-02-16 NOTE — H&P (View-Only) (Signed)
 " Leukemia Progress Note  Admit Date:12/29/2023  4:09 PM; LOS:49 Attending: Genell Gaither Paterson, MD   SUBJECTIVE  Interval History:  Tue 02/16/2024 Reports improved range of motion in knees bilaterally with less lower extremity swelling. Still having intermittent nausea, but no vomiting and tolerating PO well.   OBJECTIVE Temp:  [97.7 F (36.5 C)-99 F (37.2 C)] 98 F (36.7 C) Heart Rate:  [86-95] 87 Resp:  [17-19] 18 BP: (108-146)/(63-98) 108/63  Intake/Output Summary (Last 24 hours) at 02/16/2024 1354 Last data filed at 02/16/2024 1200 Gross per 24 hour  Intake 1992 ml  Output 4700 ml  Net -2708 ml    Physical Exam: General: NAD, appearing stated age, jaundiced  HEENT: Normocephalic, Atraumatic, MMM. Scleral icterus present Respiratory: CTAB, no wheeze/rhonchi/rales appreciated, normal work of breathing on room air  Cardiovascular: Regular rate and regular rhythm, +S1/S2; no murmurs, rubs, or gallops Abdomen: soft, NTND, bowel sounds present  Extremities: WWP, 2+ bilateral pitting edema Skin: no rashes or lesions  Neuro: Alert, following commands, no focal deficits   Relevant Interval Labs  Lab Results  Component Value Date   K 3.8 02/16/2024   BUN 7 02/16/2024   CREATININE 0.38 (L) 02/16/2024   URICACID 3.0 01/05/2024   LDH 234 02/16/2024   MG 1.9 02/16/2024   PHOS 3.6 01/07/2024   CALCIUM 8.2 (L) 02/16/2024   CALCORR 8.9 02/16/2024   BILITOT 11.5 (H) 02/16/2024   Lab Results  Component Value Date   BILITOT 11.5 (H) 02/16/2024   ALP 356 (H) 02/16/2024   AST 73 (H) 02/16/2024   ALT 55 (H) 02/16/2024    Lab Results  Component Value Date   WBC 10.20 02/16/2024   HGB 7.0 (L) 02/16/2024   PLT 242 02/16/2024   NEUTROABS 0.30 (LL) 01/07/2024   No results found.      ASSESSMENT & PLAN Deanna Anderson is a 53 y.o. female withhistory of no significant past medical history who presents to with new diagnosis of B-ALL for planned admission to start  treatment with E1910.   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 Negative for 9;22, BCR-ABL.  FISH Negative 8/6. -- CXR and EKG 8/12 w/o abnormalities  - TTE 8/12 with EF 65-70%.  LV filling pattern normal. - PICC placed 8/13 - s/p LP 8/14 w/ IT chemo.  Elevated glucose (87), elevated protein (253).  No malignant cells identified per cytology --s/p LP 8/26: Elevated glucose (88), and protein WNL (25), 38 RBCs without nucleated cells present.  Nongynecologic cytology notes no malignant cells identified -- BMBx (9/15) showing normocellular marrow with erythroid predominant trilineage hematopoiesis, no increase in blasts, no evidence of persistent acute leukemia.  PLAN: --Treatment Plan: [id number] (09/30) as per [id number] + Rituxan --TLS prophylaxis: None             - Allopurinol discontinued 8/20 --IVF hydration: KVO --PRN medications for N/V, insomnia, constipation  --Daily Labs: CBC, CMP, Ca, Mg, PO4, Uric Acid, LDH, DIC panel --Strict I/Os and weights --ASP Galactomannan Qmonday  #PEG-asparaginase induced hepatotoxicity  #PEG-asparaginase induced pancreatitis  #Hyperbilirubinemia - Patient's transaminitis has been noted to be progressive since 09/07 associated with mixed hyperbilirubinemia.  Parent initially hepatocellular, now primarily cholestatic.  Patient has prior hepatitis B surface antibody positive status and hepatitis A IgG positive status.  Overall, consistent with prior hepatitis B vaccination and  either vaccination with Twinrix or prior hepatitis A infection.  HIV serology negative.   -- RUQ US  09/09 notes hepatomegaly and hepatic steatosis.  Most likely etiology is recently administered PEG. Patient is on several hepatotoxic medications including fluconazole. Patient was noted to have mild  right upper quadrant pain on 09/12 with lipase minimally elevated at 101.  Patient continued to have rising bilirubin, though no signs of hepatic encephalopathy/asterixis or AKI. -- Overnight 9/19, patient noting diffuse abdominal pain on exam with persistent nausea. Lipase elevated at 954, amylase elevated at 214. CT A/P obtained showing acute interstitial pancreatitis without evidence of complicating features and no biliary ductal dilation observed. Likely PEG-asparaginase induced.  PLAN - bilirubin continuing to downtrend  - regular diet - continue IVF at [facility] rate  - daily CMP  #Hypoalbuminemia #Anasarca -- Developed progressive hypoalbuminemia starting 9/4 with PEG-asparaginase induced hepatic dysfunction.  On exam, appears diffusely anasarcic with 3+ bilateral lower extremity pitting edema.  Has had approximately 16 pound weight gain over admission, suspected to be due to third spacing with low albumin. PLAN: - repeat albumin assisted diuresis with IV albumin 25 g followed by IV lasix 40 mg 30 minutes later - strict I/Os and daily standing weights    HEMATOLOGY #Leukopenia #Anemia --Today: Lab Results  Component Value Date   WBC 10.20 02/16/2024   HGB 7.0 (L) 02/16/2024   PLT 242 02/16/2024   FIBRINOGEN  338 02/16/2024  - Admission CBC: WBC 2.3, Hb 7.7, Plt 17 - Blood consent signed at admission and placed in chart PLAN: - Transfusion protocol to maintain Hb >7.0, fibrinogen  >100, and Plt >10K  - Transfusions today: 1 unit pRBCs - Obtain daily CBC with differential - DIC panel daily   #Acute RLE DVT #Acute LLE DVT - Patient had ultrasound bilateral lower extremities performed on 09/15 for asymmetric LE edema and right calf pain that noted totally occluding DVT of paired peroneal calf veins in RLE.  CTA PE study negative for PE.  DVT is likely provoked in the setting of pegaspargase-induced hypercoagulability.  -- Due to increased leg pain in left calf and thigh on 9/18,  obtained left lower extremity ultrasound to evaluate for DVT.  Found to have a totally occluding acute DVT of peroneal and posterior tibial calf veins, as well as partially occluding acute DVT in the femoral, popliteal, gastrocnemius veins.  Concerned that this could be possible failure of Lovenox therapy given bilateral DVT U/S on 9/14 did not demonstrate LLE DVT and patient was initiated on therapeutic Lovenox in the interim.  Discussed with Hematology - transitioned to heparin  gtt for [facility] on 9/18.  -- Due to worsening left leg pain, repeat bilateral DVT U/S was obtained 9/21 showing unchanged RLE DVT and progressed LLE DVT. ATIII level low at 54, given 1 unit plasma. Subsequent improvement in aPTTs to therapeutic levels.  PLAN - transition heparin  gtt to xarelto (will subtract days therapeutic on heparin  gtt from xarelto load) today - Hematology consulted, appreciate recs  - if heparin  infusion rate requirements rising, reasonable to transfuse additional unit of FFP - pain regimen: PO oxycodone  5-10 mg q6h PRN for moderate-severe pain with IV dilaudid 0.6 mg q6h PRN for breakthrough pain  - patient will need durable AC plan in the outpatient setting - [employer] application submitted, planning to transition to DOAC prior to d/c    #Microcytic anemia --Patient noted to have persistent microcytic anemia since admission.  Anemia profile obtained, notes ferritin 1894, transferrin saturation 95, TIBC 293, and  iron 278.  Overall not consistent with iron deficiency anemia.  Elevated TF saturation could represent element of iron overload.  B12 330.  Patient completed regimen of vitamin B12 1000 mcg IM daily for 5 days. PLAN - monitor on daily CBC; transfusion support as above   INFECTIOUS DISEASE  # Nonneutropenic Lab Results  Component Value Date   NEUTROMANABS 9.30 (H) 02/16/2024  --HIV negative 12/16/23 at [facility], immune to hepatitis B, previous hepatitis A --ANC on admission 700 --last fever:  NA --last CXR: 12/29/23 PLAN: --Antimicrobial Tx: none  --Antimicrobial PPX: Acyclovir 400mg  BID, Bactrim DS MWF --Neutropenic Precautions --Monitor vital signs  --Fever Plan: STAT Zosyn, tylenol , BCx >24H (sputum and urine cx as appropriate), CXR >72H   OTHER:  #Hyponatremia  #Hypertriglyceridemia  -- Patient with hyponatremia that has slowly been trending downward.  Initially thought to be due to hyperglycemia although patient's blood sugar levels are lower than prior.  Serum osmolarity 291, consistent with isotonic hyponatremia.  No notable paraproteinemia on CMP.  Glucoses more controlled.  Patient does have hypertriglyceridemia that could contribute to some degree of pseudohyponatremia, although sodium corrected for TG remains low.  -- Na downtrending to 124 on 9/19, did not improve with NS administration.  Plan: - Nephrology consulted, signed off 9/25  - may discontinue IVF once Na > 130 and/or when appetite improves  - may reduce sodium checks to BID  - continue NS at [facility] rate 40 mL/hr  - BMPs BID to follow Na  #Mixed hyperlipidemia - Patient noted to have progressive mixed hyperlipidemia manifesting primarily as hypertriglyceridemia of 474 and hypercholesterolemia with LDL greater than 200, likely secondary to recent administration of pegaspargase.  Given that patient is having significant hyperbilirubinemia, initiation of statin, fibrate, and niacin are contraindicated at this time. PLAN -- continue to monitor   #Sinus tachycardia #Euthyroid sick syndrome - On 09/04, patient noted to have increased heart rate with EKG on 09/05 and sinus tachycardia.  Free T4 checked, found to be elevated at 1.4 with TSH 0.379 and total T3 39.  Endocrinology was consulted and patient briefly started on beta-blocker.  Repeat free T4 was checked on 09/11 and found to be overall unchanged at 1.3.  Additionally, thyroid-stimulating globulin, thyrotropin receptor antibody, thyroperoxidase antibodies  negative.  Given overall stability of free T4, patient's presentation consistent with euthyroid sick syndrome.  CTA PE negative for PE Lab Results  Component Value Date   TSH 2.010 02/05/2024   FREET4 1.3 (H) 01/28/2024   T3TOTAL 62 (L) 01/28/2024   TSIG <0.10 01/25/2024   THYROIDAB 1 01/25/2024   THYROTROPIN <1.10 01/25/2024  PLAN - Patient will need TSH, free T4, total T3 in 6 to 8 weeks with PCP  #Hyperglycemia 2/2 glucocorticoid use, improving #Hypoglycemia - On 8/29 evening, patient reports increased thirst associated with POC glucose of 532, improved to 220 following insulin lispro administration.  Hyperglycemia is likely in the setting of steroid use on active chemotherapy.  A1c of 7.6 is likely unreliable as patient has received multiple blood products over the course of admission.  Patient noted to have an episode of hypoglycemia on 01/19/2024 with POC glucose 54.  Patient is asymptomatic and was later hyperglycemic.  Patient's likely to require much less insulin following completion of glucocorticoid component of current treatment cycle. PLAN - discontinued SSI, has not been requiring   #Bilateral paresthesias #Lower extremity neuropathy - Patient with new onset paresthesias in bilateral fingertips that is likely related to vincristine therapy.  Does not  seem to have any notable difference on daily function or to be particularly bothersome to patient. --Patient reports lower extremity burning sensation and concern for neuropathy PLAN -- Gabapentin 300 mg at bedtime scheduled  -- Monitor daily  #Generalized deconditioning - consulted PT/OT   Chronic Medical Conditions Receives depoprovera injections every 3 months last on 12/02/23   FEN/PPX Fluids: NS 40 mL/hr  Electrolytes: replete PRN Nutrition: regular diet  DVT PPX: xarelto  Bowel Regimen: held  #Ethics: Full Code #Discharge Planning: Pending improvement of hyperbilirubinemia  Electronically signed by: Randine Dorothyann Halls, MD 02/16/2024 1:54 PM     I have seen and evaluated this patient.  I agree with the note above, which I have reviewed and edited where appropriate.  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   1. 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      -day 49 of induction according to E1910 -has achieved CR1 after induction according to E1910, MRD by Clonoseq negative -counts have recovered   2.  PEG-asparaginase induced hepatotoxicity: -bili improved today, transaminitis overall much improved -continue to trend   3.  Peg-asparaginase induced pancreatitis: CT a/p w/contrast 9/20 with acute interstitial pancreatitis w/o e/o complicating features.  No biliary duct dilatation observed.  Lipase 9/20 was 954, amylase 9/20 was 214.     -n/v improved -tolerating regular diet   4. Bil LE DVT:  R LE DVT diagnosed 01/31/24 (CT-A chest neg). Started on treatment dose lovenox and while on this, diagnosed with new L LE DVT on 9/18.  Switched to hep gtt.    -ATIII was low, PTTs improved after giving pt 1u FFP -d/c hep gtt and start xarelto  5.  Hyponatremia: -improved with ns   6.  Antimicrobial proph: -continue proph acyclovir, bactrim    7.  Fluid overload: -continue diuresing with albumin followed by lasix   Genell Paterson, MD Heme/onc staff "

## 2024-05-30 ENCOUNTER — Inpatient Hospital Stay: Payer: MEDICAID | Attending: Oncology

## 2024-05-30 ENCOUNTER — Inpatient Hospital Stay

## 2024-05-30 VITALS — BP 138/79 | HR 76 | Resp 17

## 2024-05-30 DIAGNOSIS — C9100 Acute lymphoblastic leukemia not having achieved remission: Secondary | ICD-10-CM | POA: Insufficient documentation

## 2024-05-30 LAB — CBC WITH DIFFERENTIAL/PLATELET
Abs Immature Granulocytes: 0.01 K/uL (ref 0.00–0.07)
Basophils Absolute: 0 K/uL (ref 0.0–0.1)
Basophils Relative: 0 %
Eosinophils Absolute: 0.1 K/uL (ref 0.0–0.5)
Eosinophils Relative: 2 %
HCT: 36.5 % (ref 36.0–46.0)
Hemoglobin: 11.5 g/dL — ABNORMAL LOW (ref 12.0–15.0)
Immature Granulocytes: 0 %
Lymphocytes Relative: 17 %
Lymphs Abs: 0.7 K/uL (ref 0.7–4.0)
MCH: 31.4 pg (ref 26.0–34.0)
MCHC: 31.5 g/dL (ref 30.0–36.0)
MCV: 99.7 fL (ref 80.0–100.0)
Monocytes Absolute: 0.3 K/uL (ref 0.1–1.0)
Monocytes Relative: 7 %
Neutro Abs: 2.8 K/uL (ref 1.7–7.7)
Neutrophils Relative %: 74 %
Platelets: 187 K/uL (ref 150–400)
RBC: 3.66 MIL/uL — ABNORMAL LOW (ref 3.87–5.11)
RDW: 13 % (ref 11.5–15.5)
WBC: 3.8 K/uL — ABNORMAL LOW (ref 4.0–10.5)
nRBC: 0 % (ref 0.0–0.2)

## 2024-05-30 LAB — COMPREHENSIVE METABOLIC PANEL WITH GFR
ALT: 104 U/L — ABNORMAL HIGH (ref 0–44)
AST: 52 U/L — ABNORMAL HIGH (ref 15–41)
Albumin: 4.1 g/dL (ref 3.5–5.0)
Alkaline Phosphatase: 192 U/L — ABNORMAL HIGH (ref 38–126)
Anion gap: 10 (ref 5–15)
BUN: 11 mg/dL (ref 6–20)
CO2: 25 mmol/L (ref 22–32)
Calcium: 9 mg/dL (ref 8.9–10.3)
Chloride: 105 mmol/L (ref 98–111)
Creatinine, Ser: 0.42 mg/dL — ABNORMAL LOW (ref 0.44–1.00)
GFR, Estimated: >60 mL/min
Glucose, Bld: 134 mg/dL — ABNORMAL HIGH (ref 70–99)
Potassium: 4 mmol/L (ref 3.5–5.1)
Sodium: 140 mmol/L (ref 135–145)
Total Bilirubin: 0.8 mg/dL (ref 0.0–1.2)
Total Protein: 6 g/dL — ABNORMAL LOW (ref 6.5–8.1)

## 2024-05-30 LAB — MAGNESIUM: Magnesium: 2.2 mg/dL (ref 1.7–2.4)

## 2024-05-30 LAB — SAMPLE TO BLOOD BANK

## 2024-05-30 NOTE — Progress Notes (Signed)
 Patient's port flushed without difficulty.  Good blood return noted from medial port with no bruising or swelling noted at site. No blood return noted from lateral port. Labs drawn per orders. VSS with discharge and left in satisfactory condition with no s/s of distress noted. All follow ups as scheduled.       Tityana Pagan

## 2024-06-01 ENCOUNTER — Telehealth: Payer: Self-pay | Admitting: *Deleted

## 2024-06-01 NOTE — Telephone Encounter (Signed)
 Received VM from son Selinda asking if she was in need of a blood transfusion according to lab results from Monday.  Unfortunately, he is not in her chart to receive medical information.  I reached out to husband and made him aware that labs were normal and no transfusion is needed.  Verbalized understanding and states he will make her aware.

## 2024-06-03 NOTE — Progress Notes (Addendum)
 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  CD19 and CD20 positive; CD22 negative.  Normal cytogenetics but FISH suggestive of -4, -9 and -17.  ALL fusion profile positive for TP53.   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 - Peripheral neuropathy; started on Gabapentin - Hypoalbuminemia and anasarca, s/p albumin therapy and diuresis - 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); initially on Heparin  gtt and transitioned to Xarelto at discharge - Acquired antithrombin III deficiency, 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; s/p FFP infusion - Hyponatremia thought secondary to intravascular depletion and hyperglycemia - Hypertriglyceridemia; not started on statin therapy secondary to transaminitis - Euthyroid sick syndrome; needs repeat thyroid studies 6-8 weeks post discharge   02/01/2024 Remission   Recovery bone marrow biopsy c/w remission by morphology, cytogenetics, FISH and clonoSEQ MRD   03/04/2024 - 04/01/2024 Chemotherapy   C1 Blincyto (51mcg/day) CIV x 28 days    03/18/2024 -  Intrathecal Chemotherapy   #3 LP with IT Methotrexate; CSF cytology negative   04/15/2024 -  Chemotherapy   C2 Blincyto (13mcg/day) CIV x 28 days   04/29/2024 -   Intrathecal Chemotherapy   #4 LP with IT Methotrexate; CSF cytology negative   05/06/2024 Molecular Markers   Peripheral blood clonoSEQ MRD negative   05/27/2024 -  Chemotherapy   Arm A, Cycle 2 of therapy with CTX d1 and 29; ARAC d1-4; 8-11; 29-32; 36-39; Mercaptopurine PO d1-14; 29-42; Rituxan IV d8 and 15; IT Methotrexate d1 and 15 Pegaspargase deferred Only 2 IT treatments given as she received 1 during each cycle Blincyto   05/27/2024 -  Intrathecal Chemotherapy   #5 LP with IT Methotrexate; CSF cytology pending     CURRENT STATUS: Interpreter services used for this communication.   First name of interpreter:  Boris  I saw Deanna Anderson for Dr. Perri today in follow up and for continuation of therapy. Deanna Anderson is overall tolerating therapy well thus far.  Admits to being a little more tired but eating well.  She denies any signs or symptoms of infection and no new evidence of bruising or bleeding.  She denies any chest pain or shortness of breath.  Slight nausea but no vomiting. She denies any constipation or diarrhea and no urinary symptoms.  Denies any headaches or vision changes.  Received her Depo shot yesterday locally.  A complete ROS was performed and was negative except what is noted in current status.  ECOG/Zubrod Performance Status: 1 - Strenous physical activity restricted; fully ambulatory and able to carry out light work KPS (80-70%)  Medical History[1]  Medications Ordered Prior to Encounter[2]  PHYSICAL EXAM: Review Flowsheet  More data exists      05/06/2024 05/13/2024 05/27/2024 06/03/2024  [facility] RECENT  VITALS  Height - 150 cm - -  Weight 72.394 kg 72.394 kg 72.394 kg 72.349 kg  BSA (Calculated - sq m) 1.74 m2 1.74 m2 1.74 m2 1.74 m2  Temp 98.4 F (36.9 C) 97.3 F (36.3 C) 98.3 F (36.8 C) 97.5 F (36.4 C) 98.1 F (36.7 C)  BP 123/74 122/64 134/74 116/87 130/72  Heart Rate 70 65 60 79 78  Respiratory Rate 18 16 18 18 16   Oxygen  Saturation 98 % 98 % 97 % 95 % 98 %  Oxygen Device - - - None (Room air)    Details       Multiple values from one day are sorted in reverse-chronological order         GENERAL: Deanna Anderson is a 54yo well-developed and well nourished female in no acute distress. Alert and oriented x 3 and fully ambulatory without assistance.  Examined while sitting in a treatment chair. HEENT: Nares and oropharynx clear.  No erythema or exudate.  Mucous membranes pink and moist. NECK: Supple without lymphadenopathy. CARDIOVASCULAR: Regular rate and regular rhythm; no murmurs, rubs or gallops. LUNGS: Clear to auscultation bilaterally. No wheezes, crackles or rhonchi.  ABDOMEN: Bowel sounds present.  Soft, non tender, non distended; unable to assess for hepatosplenomegaly given patient's position in a treatment chair. EXTREMITIES: No edema. 5/5 strength throughout.  Dorsiflexion / plantar flexion and handgrip strength intact bilaterally. LYMPHATIC: No cervical, supraclavicular, or axillary adenopathy SKIN: No new rashes observed. Scattered ecchymotic areas noted.  Right chest double lumen port-a-cath without erythema, warmth or tenderness. NEUROLOGIC: Nonfocal.  LABS: Recent Results (from the past 24 hours)  Comprehensive Metabolic Panel   Collection Time: 06/03/24  9:11 AM  Result Value Ref Range   Sodium 139 136 - 145 mmol/L   Potassium 3.8 3.4 - 4.5 mmol/L   Chloride 106 98 - 107 mmol/L   CO2 26 21 - 31 mmol/L   Anion Gap 7 6 - 14 mmol/L   Glucose, Random 109 (H) 70 - 99 mg/dL   Blood Urea Nitrogen (BUN) 16 7 - 25 mg/dL   Creatinine 9.58 (L) 9.39 - 1.20 mg/dL   eGFR >09 >40 fO/fpw/8.26f7   Albumin 4.4 3.5 - 5.7 g/dL   Total Protein 6.3 (L) 6.4 - 8.9 g/dL   Bilirubin, Total 0.8 0.3 - 1.0 mg/dL   Alkaline Phosphatase (ALP) 159 (H) 34 - 104 U/L   Aspartate Aminotransferase (AST) 36 13 - 39 U/L   Alanine Aminotransferase (ALT) 69 (H) 7 - 52 U/L   Calcium 9.0 8.6 - 10.3 mg/dL    BUN/Creatinine Ratio 39.0 (H) 10.0 - 20.0  Magnesium   Collection Time: 06/03/24  9:11 AM  Result Value Ref Range   Magnesium 2.0 1.9 - 2.7 mg/dL  Phosphorus   Collection Time: 06/03/24  9:11 AM  Result Value Ref Range   Phosphorus 3.9 2.5 - 5.0 mg/dL  Lactate Dehydrogenase (LDH)   Collection Time: 06/03/24  9:11 AM  Result Value Ref Range   Lactate Dehydrogenase (LDH) 140 140 - 271 U/L  Uric Acid   Collection Time: 06/03/24  9:11 AM  Result Value Ref Range   Uric Acid 4.1 2.3 - 6.6 mg/dL  CBC with Differential   Collection Time: 06/03/24  9:11 AM  Result Value Ref Range   WBC 2.30 (L) 4.40 - 11.00 10*3/uL   RBC 3.27 (L) 4.10 - 5.10 10*6/uL   Hemoglobin 10.2 (L) 12.3 - 15.3 g/dL   Hematocrit 68.8 (L) 64.0 -  44.6 %   Mean Corpuscular Volume (MCV) 94.9 80.0 - 96.0 fL   Mean Corpuscular Hemoglobin (MCH) 31.0 27.5 - 33.2 pg   Mean Corpuscular Hemoglobin Conc (MCHC) 32.7 (L) 33.0 - 37.0 g/dL   Red Cell Distribution Width (RDW) 13.4 12.3 - 17.0 %   Platelet Count (PLT) 133 (L) 150 - 450 10*3/uL   Mean Platelet Volume (MPV) 7.6 6.8 - 10.2 fL   Neutrophils % 71 %   Lymphocytes % 24 %   Monocytes % 3 %   Eosinophils % 2 %   Basophils % 0 %   nRBC % 0 %   Neutrophils Absolute 1.60 (L) 1.80 - 7.80 10*3/uL   Lymphocytes # 0.50 (L) 1.00 - 4.80 10*3/uL   Monocytes # 0.10 0.00 - 0.80 10*3/uL   Eosinophils # 0.00 0.00 - 0.50 10*3/uL   Basophils # 0.00 0.00 - 0.20 10*3/uL   nRBC Absolute 0.00 <=0.00 10*3/uL      IMPRESSION and PLAN:   Impression Plan  B-cell ALL, CD19 and CD20 positive, CD22 negative, diagnosed locally 12/17/23 and confirmed here 12/23/23 with normal cytogenetics but FISH suggestive of -4, -9 and -17; ALL fusion profile positive for TP53.  Receiving treatment acc. Z8089 with recovery bone marrow biopsy 02/01/24 c/w remission by morphology, cytogenetics, FISH and clonoSEQ MRD.   - Day 8 of Arm A, Cycle 2 of therapy.  Counts falling as expected.  Continues oral  Mercaptopurine as instructed (through day 14) and will resume ARAC subcutaneous today for days 8-11.  Received IV Rituxan today in clinic.  Will have labs locally 06/06/24 and return to clinic 06/10/24 for repeat labs, evaluation and day 15 of therapy with IV Rituxan and LP with IT Methotrexate administration.  Planning for only 2 LP procedures with this cycle (day 1 and 15) and will DEFER Pegaspargase therapy.  Deanna Anderson and her friend verbalized understanding of the plan and were encouraged to call in between appointments with questions or concerns.   - Last clonoSEQ on bone marrow 02/01/24 negative.  Repeat peripheral blood sample negative on 05/06/24; will obtain again at the end of Arm A, Cycle 2 of therapy.    Transfusions None required today.    ID - Afebrile and not neutropenic.  No evidence of active infection.  Encouraged to call with s/sx of infection or temp > 100.4.   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   - Previously on Bactrim MWF for PCP ppx, but held secondary to LFTs.  Currently on Dapsone 100mg /day (started 03/04/24); G-6PD sufficient 02/26/24.     Neuro Peripheral neuropathy, continuing Gabapentin at bedtime.    Cardiac TTE 12/30/23 with EF of 65-70%.    Endocrine - Euthyroid sick syndrome during induction; repeat thyroid function studies (TSH, T3 and T4) obtained 04/01/24 WNL.   - Hypercholesterolemia and hypertriglyceridemia likely secondary to Pegaspargase therapy.  Will need to consider statin therapy once LFTs further improve.   - Hyperglycemia during induction, thought steroid-induced, but with recurrent issues after discharge.  Currently having patient monitor BG at home and administer SSI PRN.  Saw GMT 05/04/24.    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  Will HOLD Xarelto 72 hours prior to invasive procedures;  patient instructed to hold Xarelto on 06/06/24 in preparation for next LP on 06/10/24.   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  Tbili and LFTs improved and lipase now WNL.  Will plan to DEFER all further Pegaspargase therapy.   - Persistent nausea without vomiting; only minimal improvement with Compazine and Zofran .  Started Zyprexa 2.5mg  PO at bedtime 02/23/24 with improvement noted.    GYN Patient receives DepoProvera injections every 3 months for contraceptive purposes and menses suppression; last received locally 06/02/24; next due ~ 08/31/24.  Denies any menstrual bleeding.    Derm Generalized dry skin; using topical emollient therapy (ie. Aquaphor or Eucerin).  Will monitor.    Psych Recent issues with insomnia, primarily with falling asleep.  Using OTC Melatonin therapy with improvement noted    FEN - Hyponatremia thought secondary to intravascular depletion and hyperglycemia; resolved.   - Hypoalbuminemia and anasarca during induction, s/p albumin administration and diuresis.   - Folate deficiency, continuing oral supplementation.   - Received B12 injections x 5 days during induction for level of 330.    Health Maintenance - Last screening mammogram 05/2023 negative.  Plans to schedule next mammogram for 06/2024 locally.   - Cologuard testing 05/2023 negative.    Prophylaxis Patient declined the seasonal influenza vaccine.    Access Right chest double lumen port-a-cath placed 02/19/24.       Duwaine KATHEE Lot, NP 9:16 AM 06/03/2024  For  Karie Monte, MD  I have reviewed history, findings, assessment, and plan. I have discussed with patient and with APP.  I agree with plan.  Electronically signed by: Karie LITTIE Monte, MD 06/04/2024 10:57 PM                 [1] Past Medical History: Diagnosis Date   Anemia     B-cell acute lymphoblastic leukemia    (CMD) 12/17/2023  [2] Current Outpatient Medications on File Prior to Visit  Medication Sig Dispense Refill   acyclovir (ZOVIRAX) 400 mg tablet Take 1 tablet (400 mg total) by mouth 2 (two) times a day. 180 tablet 3   cyanocobalamin  (VITAMIN B12) 1,000 mcg tablet Take 1 tablet (1,000 mcg total) by mouth daily. (Patient taking differently: Take 1,000 mcg by mouth daily.) 100 tablet 3   cytarabine PF (CYSTOSAR) 2 gram/20 mL (100 mg/mL) chemo syringe Inject 1.3 mL (130 mg total) under the skin See Admin Instructions. Inject 1.3 mL (130 mg) under the skin on days 1-4 (1/9-1/12) and days 8-11 (1/16-1/19) of chemotherapy cycle. 10.4 mL 0   dapsone 100 mg tablet Take 1 tablet (100 mg total) by mouth daily. (Patient taking differently: Take 100 mg by mouth daily.) 90 tablet 3   folic acid  (FOLVITE ) 1 mg tablet Take 1 tablet (1 mg total) by mouth daily. (Patient taking differently: Take 1 mg by mouth daily.) 90 tablet 3   gabapentin (NEURONTIN) 300 mg capsule Take 1 capsule (300 mg total) by mouth at bedtime. 90 capsule 3   glucose blood test strip Use as directed. 100 each 1   glucose monitoring kit kit 1 each by miscellaneous route 4 (four) times a day after meals and at bedtime. 1 each 0   insulin lispro (HumaLOG KwikPen Insulin) 100 unit/mL KwikPen Administer as per sliding scale with meals and at bedtime (Max 40 Units per day)   Glucose level ______# units 150-200     2 units 201-250     4 units 251-300     6 units 301-350  8 units 351-400     10 units > 400    Call clinic 3 mL 3   Lancets misc Use as directed. 100 each 1   melatonin 5 mg tablet Take 5 mg by mouth at bedtime.     mercaptopurine (PURINETHOL) 50 mg chemo tablet Take 2 tablets (100 mg) once daily on Monday through Saturday. Take 2.5 tablets (125 mg) on Sundays. Take days 1-14 of chemotherapy cycle. Take at bedtime 1 hour before or 2 hours after food and/or milk products. 29 tablet  0   methocarbamoL  (ROBAXIN ) 750 mg tablet Take 750 mg by mouth 3 (three) times a day as needed for muscle spasms.     OLANZapine (ZyPREXA) 2.5 mg tablet Take 1 tablet (2.5 mg total) by mouth at bedtime. 30 tablet 3   prochlorperazine (COMPAZINE) 10 mg tablet Take 1 tablet (10 mg total) by mouth every 6 (six) hours as needed for nausea or vomiting. 30 tablet 1   rivaroxaban (XARELTO) 20 mg tablet Take 1 tablet (20 mg total) by mouth daily with dinner. Start taking on 02/28/2024. 90 tablet 0   No current facility-administered medications on file prior to visit.

## 2024-06-06 ENCOUNTER — Inpatient Hospital Stay: Payer: MEDICAID

## 2024-06-06 DIAGNOSIS — C9100 Acute lymphoblastic leukemia not having achieved remission: Secondary | ICD-10-CM

## 2024-06-06 LAB — CBC WITH DIFFERENTIAL/PLATELET
Basophils Absolute: 0 K/uL (ref 0.0–0.1)
Basophils Relative: 0 %
Eosinophils Absolute: 0.1 K/uL (ref 0.0–0.5)
Eosinophils Relative: 5 %
HCT: 29.3 % — ABNORMAL LOW (ref 36.0–46.0)
Hemoglobin: 9.2 g/dL — ABNORMAL LOW (ref 12.0–15.0)
Lymphocytes Relative: 13 %
Lymphs Abs: 0.3 K/uL — ABNORMAL LOW (ref 0.7–4.0)
MCH: 31.4 pg (ref 26.0–34.0)
MCHC: 31.4 g/dL (ref 30.0–36.0)
MCV: 100 fL (ref 80.0–100.0)
Monocytes Absolute: 0 K/uL — ABNORMAL LOW (ref 0.1–1.0)
Monocytes Relative: 2 %
Neutro Abs: 1.6 K/uL — ABNORMAL LOW (ref 1.7–7.7)
Neutrophils Relative %: 80 %
Platelets: 76 K/uL — ABNORMAL LOW (ref 150–400)
RBC: 2.93 MIL/uL — ABNORMAL LOW (ref 3.87–5.11)
RDW: 12.3 % (ref 11.5–15.5)
WBC: 2 K/uL — ABNORMAL LOW (ref 4.0–10.5)
nRBC: 0 % (ref 0.0–0.2)

## 2024-06-06 LAB — COMPREHENSIVE METABOLIC PANEL WITH GFR
ALT: 97 U/L — ABNORMAL HIGH (ref 0–44)
AST: 41 U/L (ref 15–41)
Albumin: 4.1 g/dL (ref 3.5–5.0)
Alkaline Phosphatase: 188 U/L — ABNORMAL HIGH (ref 38–126)
Anion gap: 14 (ref 5–15)
BUN: 15 mg/dL (ref 6–20)
CO2: 19 mmol/L — ABNORMAL LOW (ref 22–32)
Calcium: 8.8 mg/dL — ABNORMAL LOW (ref 8.9–10.3)
Chloride: 106 mmol/L (ref 98–111)
Creatinine, Ser: 0.45 mg/dL (ref 0.44–1.00)
GFR, Estimated: >60 mL/min
Glucose, Bld: 177 mg/dL — ABNORMAL HIGH (ref 70–99)
Potassium: 4.1 mmol/L (ref 3.5–5.1)
Sodium: 139 mmol/L (ref 135–145)
Total Bilirubin: 0.7 mg/dL (ref 0.0–1.2)
Total Protein: 6 g/dL — ABNORMAL LOW (ref 6.5–8.1)

## 2024-06-06 LAB — SAMPLE TO BLOOD BANK

## 2024-06-06 LAB — MAGNESIUM: Magnesium: 2 mg/dL (ref 1.7–2.4)

## 2024-06-06 NOTE — Progress Notes (Signed)
 Patients port flushed without difficulty.  Good blood return noted with no bruising or swelling noted at site.  Labs drawn per orders.  VSS with discharge and left in satisfactory condition with no s/s of distress noted. All follow ups as scheduled.       Kierre Deines

## 2024-06-13 ENCOUNTER — Inpatient Hospital Stay: Payer: MEDICAID

## 2024-06-13 NOTE — Telephone Encounter (Signed)
 Agree.  Thank you!  Electronically signed by: Duwaine KATHEE Lot, NP 06/13/2024 8:19 AM

## 2024-06-16 ENCOUNTER — Other Ambulatory Visit: Payer: Self-pay | Admitting: Obstetrics and Gynecology

## 2024-06-16 ENCOUNTER — Inpatient Hospital Stay: Payer: MEDICAID

## 2024-06-16 DIAGNOSIS — C9100 Acute lymphoblastic leukemia not having achieved remission: Secondary | ICD-10-CM

## 2024-06-16 DIAGNOSIS — Z1231 Encounter for screening mammogram for malignant neoplasm of breast: Secondary | ICD-10-CM

## 2024-06-16 LAB — COMPREHENSIVE METABOLIC PANEL WITH GFR
ALT: 100 U/L — ABNORMAL HIGH (ref 0–44)
AST: 47 U/L — ABNORMAL HIGH (ref 15–41)
Albumin: 4.7 g/dL (ref 3.5–5.0)
Alkaline Phosphatase: 143 U/L — ABNORMAL HIGH (ref 38–126)
Anion gap: 14 (ref 5–15)
BUN: 23 mg/dL — ABNORMAL HIGH (ref 6–20)
CO2: 20 mmol/L — ABNORMAL LOW (ref 22–32)
Calcium: 9.2 mg/dL (ref 8.9–10.3)
Chloride: 105 mmol/L (ref 98–111)
Creatinine, Ser: 0.58 mg/dL (ref 0.44–1.00)
GFR, Estimated: >60 mL/min
Glucose, Bld: 176 mg/dL — ABNORMAL HIGH (ref 70–99)
Potassium: 4.2 mmol/L (ref 3.5–5.1)
Sodium: 140 mmol/L (ref 135–145)
Total Bilirubin: 0.6 mg/dL (ref 0.0–1.2)
Total Protein: 6.6 g/dL (ref 6.5–8.1)

## 2024-06-16 LAB — SAMPLE TO BLOOD BANK

## 2024-06-16 LAB — CBC WITH DIFFERENTIAL/PLATELET
Abs Immature Granulocytes: 0.03 10*3/uL (ref 0.00–0.07)
Basophils Absolute: 0 10*3/uL (ref 0.0–0.1)
Basophils Relative: 0 %
Eosinophils Absolute: 0 10*3/uL (ref 0.0–0.5)
Eosinophils Relative: 3 %
HCT: 25.2 % — ABNORMAL LOW (ref 36.0–46.0)
Hemoglobin: 8.3 g/dL — ABNORMAL LOW (ref 12.0–15.0)
Immature Granulocytes: 4 %
Lymphocytes Relative: 68 %
Lymphs Abs: 0.5 10*3/uL — ABNORMAL LOW (ref 0.7–4.0)
MCH: 29.9 pg (ref 26.0–34.0)
MCHC: 32.9 g/dL (ref 30.0–36.0)
MCV: 90.6 fL (ref 80.0–100.0)
Monocytes Absolute: 0.1 10*3/uL (ref 0.1–1.0)
Monocytes Relative: 10 %
Neutro Abs: 0.1 10*3/uL — CL (ref 1.7–7.7)
Neutrophils Relative %: 15 %
Platelets: 89 10*3/uL — ABNORMAL LOW (ref 150–400)
RBC: 2.78 MIL/uL — ABNORMAL LOW (ref 3.87–5.11)
RDW: 12.4 % (ref 11.5–15.5)
WBC: 0.7 10*3/uL — CL (ref 4.0–10.5)
nRBC: 0 % (ref 0.0–0.2)

## 2024-06-16 LAB — MAGNESIUM: Magnesium: 2.1 mg/dL (ref 1.7–2.4)

## 2024-06-16 NOTE — Patient Instructions (Signed)
 Instrucciones al darle de alta: Discharge Instructions Gracias por elegir al [facility] de [place] para brindarle atencin mdica de oncologa y teacher, english as a foreign language.  Si usted tiene cita de laboratorio con el [facility], por favor entre por la puerta principal y regstrese en el escritorio central de informacin.   Use ropa cmoda y adecuada para tener fcil acceso a las vas del Portacath (acceso venoso de larga duracin) o la lnea PICC (catter central colocado por va perifrica).   Nos esforzamos por ofrecerle tiempo de calidad con su proveedor. Es posible que tenga que volver a programar su cita si llega tarde (15 minutos o ms).  El llegar tarde le afecta a usted y a otros pacientes cuyas citas son posteriores a armed forces operational officer.  Adems, si usted falta a tres o ms citas sin avisar a la oficina, puede ser retirado(a) de la clnica a discrecin del proveedor.      Para las solicitudes de renovacin de recetas, pida a su farmacia que se ponga en contacto con nuestra oficina y deje que transcurran 72 horas para que se complete el proceso de las renovaciones.    Hoy usted recibi los siguientes agentes de quimioterapia e/o inmunoterapia port flush with labs, return as scheduled.   Para ayudar a prevenir las nuseas y los vmitos despus de su tratamiento, le recomendamos que tome su medicamento para las nuseas segn las indicaciones.  LOS SNTOMAS QUE DEBEN COMUNICARSE INMEDIATAMENTE SE INDICAN A CONTINUACIN: *FIEBRE SUPERIOR A 100.4 F (38 C) O MS *ESCALOFROS O SUDORACIN *NUSEAS Y VMITOS QUE NO SE CONTROLAN CON EL MEDICAMENTO PARA LAS NUSEAS *DIFICULTAD INUSUAL PARA RESPIRAR  *MORETONES O HEMORRAGIAS NO HABITUALES *PROBLEMAS URINARIOS (dolor o ardor al geographical information systems officer o frecuencia para geographical information systems officer) *PROBLEMAS INTESTINALES (diarrea inusual, estreimiento, dolor cerca del ano) SENSIBILIDAD EN LA BOCA Y EN LA GARGANTA CON O SIN LA PRESENCIA DE LCERAS (dolor de garganta, llagas en la boca o dolor  de muelas/dientes) ERUPCIN, HINCHAZN O DOLORES INUSUALES FLUJO VAGINAL INUSUAL O PICAZN/RASQUIA    Los puntos marcados con un asterisco ( *) indican una posible emergencia y debe hacer un seguimiento tan pronto como le sea posible o vaya al Departamento de Emergencias si se le presenta algn problema.  Por favor, muestre la [place] DE ADVERTENCIA DE BABS [place] DE ADVERTENCIA DE LTANYA al registrarse en [address] de Emergencias y a la enfermera de triaje.  Si tiene preguntas despus de su visita o necesita cancelar o volver a programar su cita, por favor pngase en contacto con [facility] - A DEPT OF [facility] [phone]  y siga las instrucciones. Las horas de oficina son de 8:00 a.m. a 4:30 p.m. de lunes a viernes. Por favor, tenga en cuenta que los mensajes de voz que se dejan despus de las 4:00 p.m. posiblemente no se devolvern hasta el siguiente da de trabajo.  Cerramos los fines de semana y [employer]. En todo momento tiene acceso a una enfermera para preguntas urgentes. Por favor, llame al nmero principal de la clnica [phone] y siga las instrucciones.   Para cualquier pregunta que no sea de carcter urgente, tambin puede ponerse en contacto con su proveedor utilizando MyChart. Ahora ofrecemos visitas electrnicas para cualquier persona mayor de 18 aos que solicite atencin mdica en lnea para los sntomas que no sean urgentes. Para ms detalles vaya a [email or web address].   Tambin puede bajar la aplicacin de MyChart! Vaya a la tienda de  aplicaciones, busque MyChart, abra la aplicacin, seleccione [place], e ingrese con su nombre de usuario y la contrasea de Clinical Cytogeneticist.

## 2024-06-16 NOTE — Progress Notes (Signed)
 CRITICAL VALUE ALERT Critical value received:  WBC 0.7 and ANC 0.1.  Date of notification:  06-16-2024 Time of notification: 11:58 am Critical value read back:  Yes.   Nurse who received alert:  B.Merin Borjon RN  MD notified time and response:  Dr. Davonna at 12:15 pm. [facility] patient. Lab work faxed to [facility]. No blood products or platelets needed per orders.

## 2024-06-16 NOTE — Progress Notes (Signed)
 Port flushed with good blood return noted. No bruising or swelling at site. Bandaid applied and patient discharged in satisfactory condition. VVS stable with no signs or symptoms of distressed noted.

## 2024-06-19 ENCOUNTER — Encounter: Payer: Self-pay | Admitting: *Deleted

## 2024-06-20 ENCOUNTER — Inpatient Hospital Stay: Payer: MEDICAID

## 2024-08-04 ENCOUNTER — Ambulatory Visit

## 2024-08-04 ENCOUNTER — Encounter
